# Patient Record
Sex: Male | Born: 1960
Health system: Southern US, Community
[De-identification: ages and names within clinical notes are randomized; demographics above are authoritative.]

## PROBLEM LIST (undated history)

## (undated) DIAGNOSIS — E669 Obesity, unspecified: Secondary | ICD-10-CM

## (undated) DIAGNOSIS — Z6830 Body mass index (BMI) 30.0-30.9, adult: Secondary | ICD-10-CM

## (undated) DIAGNOSIS — N529 Male erectile dysfunction, unspecified: Secondary | ICD-10-CM

## (undated) HISTORY — DX: Male erectile dysfunction, unspecified: N52.9

## (undated) HISTORY — DX: Obesity, unspecified: E66.9

## (undated) HISTORY — DX: Body mass index (BMI) 30.0-30.9, adult: Z68.30

---

## 2017-05-05 ENCOUNTER — Encounter: Payer: Self-pay | Admitting: Family Medicine

## 2017-05-05 ENCOUNTER — Ambulatory Visit (INDEPENDENT_AMBULATORY_CARE_PROVIDER_SITE_OTHER): Payer: BLUE CROSS/BLUE SHIELD | Admitting: Family Medicine

## 2017-05-05 VITALS — BP 144/94 | HR 86 | Temp 98.5°F | Resp 18 | Ht 70.98 in | Wt 227.0 lb

## 2017-05-05 DIAGNOSIS — Z13220 Encounter for screening for lipoid disorders: Secondary | ICD-10-CM

## 2017-05-05 DIAGNOSIS — Z125 Encounter for screening for malignant neoplasm of prostate: Secondary | ICD-10-CM | POA: Diagnosis not present

## 2017-05-05 DIAGNOSIS — Z0000 Encounter for general adult medical examination without abnormal findings: Secondary | ICD-10-CM | POA: Diagnosis not present

## 2017-05-05 DIAGNOSIS — Z131 Encounter for screening for diabetes mellitus: Secondary | ICD-10-CM | POA: Diagnosis not present

## 2017-05-05 DIAGNOSIS — Z23 Encounter for immunization: Secondary | ICD-10-CM | POA: Diagnosis not present

## 2017-05-05 DIAGNOSIS — Z1211 Encounter for screening for malignant neoplasm of colon: Secondary | ICD-10-CM | POA: Diagnosis not present

## 2017-05-05 DIAGNOSIS — Z1159 Encounter for screening for other viral diseases: Secondary | ICD-10-CM | POA: Diagnosis not present

## 2017-05-05 DIAGNOSIS — N529 Male erectile dysfunction, unspecified: Secondary | ICD-10-CM | POA: Diagnosis not present

## 2017-05-05 MED ORDER — SILDENAFIL CITRATE 100 MG PO TABS: 100.0000 mg | ORAL_TABLET | Freq: Every day | ORAL | 11 refills | Status: DC | PRN

## 2017-05-05 MED ORDER — SILDENAFIL CITRATE 100 MG PO TABS: 100.0000 mg | ORAL_TABLET | Freq: Every day | ORAL | 0 refills | Status: DC | PRN

## 2017-05-05 NOTE — Progress Notes (Signed)
Chief Complaint  Patient presents with  . Medication Refill    viagra    HPI  Patient is here to establish care and get a physical exam He also needs refills of his viagra.  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 [employer]  He reports that he has been on Viagra for more than 10 years He denies history of hypertension, hyperlipidemia or diabetes He reports that he has never had any surgeries or injuries He does not know why he has ED He reports that his ED is that he has difficulty consistently achieving erections He states that he gets about 5 tabs at a time and that works for him He denies headaches, runny nose, chest pains or sustained erections  His family history of  Negative for dm, cad and hypertension He puffs cigars but "I don't inhale I just puff"   Dental exam: up to date Eye exam: not up to date Exercise: daily walking at work   History reviewed. No pertinent past medical history.  Current Outpatient Prescriptions  Medication Sig Dispense Refill  . sildenafil (VIAGRA) 100 MG tablet Take 1 tablet (100 mg total) by mouth daily as needed for erectile dysfunction. 5 tablet 11   No current facility-administered medications for this visit.     Allergies: No Known Allergies  History reviewed. No pertinent surgical history.  Social History   Social History  . Marital status: Single    Spouse name: N/A  . Number of children: N/A  . Years of education: N/A   Social History Main Topics  . Smoking status: Current Some Day Smoker    Types: Cigars  . Smokeless tobacco: Never Used  . Alcohol use 1.8 oz/week    3 Cans of beer per week  . Drug use: No  . Sexual activity: Yes    Birth control/ protection: Condom   Other Topics Concern  . None   Social History Narrative  . None    Review of Systems  Constitutional: Negative for chills, fever, malaise/fatigue and weight loss.  Eyes: Negative for blurred vision and double vision.  Respiratory: Negative  for cough, shortness of breath and wheezing.   Cardiovascular: Negative for chest pain and palpitations.  Gastrointestinal: Negative for abdominal pain, nausea and vomiting.  Genitourinary: Negative for dysuria, frequency and urgency.  Musculoskeletal: Negative for back pain, myalgias and neck pain.  Neurological: Negative for dizziness, tingling and headaches.  Psychiatric/Behavioral: Negative for depression. The patient is not nervous/anxious.     Objective: Vitals:   05/05/17 0928  BP: (!) 146/82  Pulse: 86  Resp: 18  Temp: 98.5 F (36.9 C)  TempSrc: Oral  SpO2: 97%  Weight: 227 lb (103 kg)  Height: 5' 10.98" (1.803 m)    Physical Exam  Constitutional: He is oriented to person, place, and time. He appears well-developed and well-nourished.  HENT:  Head: Normocephalic and atraumatic.  Right Ear: External ear normal.  Left Ear: External ear normal.  Mouth/Throat: Oropharynx is clear and moist.  Eyes: Conjunctivae and EOM are normal.  Neck: Normal range of motion. No thyromegaly present.  Cardiovascular: Normal rate, regular rhythm and normal heart sounds.   Pulmonary/Chest: Effort normal and breath sounds normal. No respiratory distress. He has no wheezes.  Abdominal: Soft. Bowel sounds are normal. He exhibits no distension. There is no tenderness. There is no guarding.  Musculoskeletal: Normal range of motion. He exhibits no edema.  Neurological: He is alert and oriented to person, place, and time.  Skin: Skin is warm. Capillary refill takes less than 2 seconds.  Psychiatric: He has a normal mood and affect. His behavior is normal. Judgment and thought content normal.    Assessment and Plan Fotios was seen today for medication refill.  Diagnoses and all orders for this visit:  Health maintenance examination- discussed age appropriate screenings and immunizations  Erectile dysfunction, unspecified erectile dysfunction type -     TestT+TestF+SHBG  Screening for  prostate cancer -     PSA  Screening, lipid -     Lipid panel -     Comprehensive metabolic panel  Screening for diabetes mellitus -     Comprehensive metabolic panel -     Hemoglobin A1c  Need for Tdap vaccination -     Tdap vaccine greater than or equal to 7yo IM  Encounter for screening colonoscopy -     HM COLONOSCOPY  Other orders -     Discontinue: sildenafil (VIAGRA) 100 MG tablet; Take 1 tablet (100 mg total) by mouth daily as needed for erectile dysfunction. -     sildenafil (VIAGRA) 100 MG tablet; Take 1 tablet (100 mg total) by mouth daily as needed for erectile dysfunction.     Zooey Schreurs A Glenn Christo

## 2017-05-05 NOTE — Patient Instructions (Addendum)
     IF you received an x-ray today, you will receive an invoice from [facility]. Please contact [facility] at [phone] with questions or concerns regarding your invoice.   IF you received labwork today, you will receive an invoice from [facility]. Please contact [facility] at [phone] with questions or concerns regarding your invoice.   Our billing staff will not be able to assist you with questions regarding bills from these companies.  You will be contacted with the lab results as soon as they are available. The fastest way to get your results is to activate your My Chart account. Instructions are located on the last page of this paperwork. If you have not heard from us regarding the results in 2 weeks, please contact this office.     Colorectal Cancer Screening Colorectal cancer screening is a group of tests used to check for colorectal cancer. Colorectal refers to your colon and rectum. Your colon and rectum are located at the end of your large intestine and carry your bowel movements out of your body. Why is colorectal cancer screening done? It is common for abnormal growths (polyps) to form in the lining of your colon, especially as you get older. These polyps can be cancerous or become cancerous. If colorectal cancer is found at an early stage, it is treatable. Who should be screened for colorectal cancer? Screening is recommended for all adults at average risk starting at age 50. Tests may be recommended every 1 to 10 years. Your health care provider may recommend earlier or more frequent screening if you have:  A history of colorectal cancer or polyps.  A family member with a history of colorectal cancer or polyps.  Inflammatory bowel disease, such as ulcerative colitis or Crohn disease.  A type of hereditary colon cancer syndrome.  Colorectal cancer symptoms.  Types of screening tests There are several types of colorectal screening tests. They  include:  Guaiac-based fecal occult blood testing.  Fecal immunochemical test (FIT).  Stool DNA test.  Barium enema.  Virtual colonoscopy.  Sigmoidoscopy. During this test, a sigmoidoscope is used to examine your rectum and lower colon. A sigmoidoscope is a flexible tube with a camera that is inserted through your anus into your rectum and lower colon.  Colonoscopy. During this test, a colonoscope is used to examine your entire colon. A colonoscope is a long, thin, flexible tube with a camera. This test examines your entire colon and rectum.  This information is not intended to replace advice given to you by your health care provider. Make sure you discuss any questions you have with your health care provider. Document Released: 04/16/2010 Document Revised: 06/05/2016 Document Reviewed: 02/02/2014 Elsevier Interactive Patient Education  2017 Elsevier Inc.  

## 2017-05-06 LAB — HCV INTERPRETATION

## 2017-05-06 LAB — HCV AB W/RFLX TO VERIFICATION: HCV Ab: <0.1 s/co ratio (ref 0.0–0.9)

## 2017-05-07 LAB — TESTT+TESTF+SHBG
Sex Hormone Binding: 45.6 nmol/L (ref 19.3–76.4)
Testosterone, Free: 13.6 pg/mL (ref 7.2–24.0)
Testosterone, total: 519.2 ng/dL (ref 264.0–916.0)

## 2017-05-07 LAB — COMPREHENSIVE METABOLIC PANEL
ALT: 29 IU/L (ref 0–44)
AST: 21 IU/L (ref 0–40)
Albumin/Globulin Ratio: 1.8 (ref 1.2–2.2)
Albumin: 4.3 g/dL (ref 3.5–5.5)
Alkaline Phosphatase: 65 IU/L (ref 39–117)
BUN/Creatinine Ratio: 12 (ref 9–20)
BUN: 12 mg/dL (ref 6–24)
Bilirubin Total: 0.5 mg/dL (ref 0.0–1.2)
CO2: 22 mmol/L (ref 20–29)
Calcium: 9 mg/dL (ref 8.7–10.2)
Chloride: 103 mmol/L (ref 96–106)
Creatinine, Ser: 0.97 mg/dL (ref 0.76–1.27)
GFR calc Af Amer: 101 mL/min/{1.73_m2} (ref >59)
GFR calc non Af Amer: 88 mL/min/{1.73_m2} (ref >59)
Globulin, Total: 2.4 g/dL (ref 1.5–4.5)
Glucose: 107 mg/dL — ABNORMAL HIGH (ref 65–99)
Potassium: 4.3 mmol/L (ref 3.5–5.2)
Sodium: 138 mmol/L (ref 134–144)
Total Protein: 6.7 g/dL (ref 6.0–8.5)

## 2017-05-07 LAB — LIPID PANEL
Chol/HDL Ratio: 4.4 ratio (ref 0.0–5.0)
Cholesterol, Total: 168 mg/dL (ref 100–199)
HDL: 38 mg/dL — ABNORMAL LOW (ref >39)
LDL Calculated: 114 mg/dL — ABNORMAL HIGH (ref 0–99)
Triglycerides: 80 mg/dL (ref 0–149)
VLDL Cholesterol Cal: 16 mg/dL (ref 5–40)

## 2017-05-07 LAB — HEMOGLOBIN A1C
Est. average glucose Bld gHb Est-mCnc: 100 mg/dL
Hgb A1c MFr Bld: 5.1 % (ref 4.8–5.6)

## 2017-05-07 LAB — PSA: Prostate Specific Ag, Serum: 1.5 ng/mL (ref 0.0–4.0)

## 2017-05-28 ENCOUNTER — Other Ambulatory Visit: Payer: Self-pay | Admitting: Physician Assistant

## 2017-05-28 ENCOUNTER — Encounter: Payer: Self-pay | Admitting: Physician Assistant

## 2017-05-28 ENCOUNTER — Ambulatory Visit (INDEPENDENT_AMBULATORY_CARE_PROVIDER_SITE_OTHER): Payer: BLUE CROSS/BLUE SHIELD | Admitting: Physician Assistant

## 2017-05-28 ENCOUNTER — Telehealth: Payer: Self-pay | Admitting: Physician Assistant

## 2017-05-28 ENCOUNTER — Ambulatory Visit (HOSPITAL_COMMUNITY)
Admission: RE | Admit: 2017-05-28 | Discharge: 2017-05-28 | Disposition: A | Discharge status: Home / Self Care | Payer: BLUE CROSS/BLUE SHIELD | Source: Ambulatory Visit | Attending: Physician Assistant | Admitting: Physician Assistant

## 2017-05-28 VITALS — BP 148/93 | HR 86 | Temp 98.7°F | Resp 18 | Ht 70.98 in | Wt 225.2 lb

## 2017-05-28 DIAGNOSIS — N5089 Other specified disorders of the male genital organs: Secondary | ICD-10-CM

## 2017-05-28 DIAGNOSIS — N50811 Right testicular pain: Secondary | ICD-10-CM

## 2017-05-28 DIAGNOSIS — N433 Hydrocele, unspecified: Secondary | ICD-10-CM | POA: Diagnosis not present

## 2017-05-28 DIAGNOSIS — N451 Epididymitis: Secondary | ICD-10-CM

## 2017-05-28 DIAGNOSIS — R030 Elevated blood-pressure reading, without diagnosis of hypertension: Secondary | ICD-10-CM | POA: Diagnosis not present

## 2017-05-28 LAB — POCT URINALYSIS DIP (MANUAL ENTRY)
Bilirubin, UA: NEGATIVE
Blood, UA: NEGATIVE
Glucose, UA: NEGATIVE mg/dL
Ketones, POC UA: NEGATIVE mg/dL
Nitrite, UA: NEGATIVE
Protein Ur, POC: NEGATIVE mg/dL
Spec Grav, UA: 1.015 (ref 1.010–1.025)
Urobilinogen, UA: 1 E.U./dL
pH, UA: 7 (ref 5.0–8.0)

## 2017-05-28 MED ORDER — CIPROFLOXACIN HCL 500 MG PO TABS: 500.0000 mg | ORAL_TABLET | Freq: Two times a day (BID) | ORAL | 0 refills | Status: DC

## 2017-05-28 NOTE — Progress Notes (Signed)
Brad Simmons  MRN: [id number] DOB: 27-May-1961  PCP: Doristine Bosworth, MD  Chief Complaint  Patient presents with  . pulled muscle    right side of groin area x1week   . Groin Swelling    Subjective:  Pt presents to clinic for right side groin pain that started about 5 days ago without known injury.  Sore in right lower abdomen and radiates right side scrotum that is also swollen.  He has used advil and that helped some with the pain but then it started getting worse.  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  He has never had a hernia in the past.  Lifting does not worsen the pain.  No trauma to the scrotum.  Sexually active - no change in partners - wears condoms everytime  History is obtained by patient.  Review of Systems  Constitutional: Negative for chills and fever.  Gastrointestinal: Positive for abdominal pain (soreness). Negative for nausea and vomiting.  Genitourinary: Positive for testicular pain. Negative for discharge, dysuria and hematuria.    Patient Active Problem List   Diagnosis Date Noted  . Erectile dysfunction 05/05/2017    Current Outpatient Prescriptions on File Prior to Visit  Medication Sig Dispense Refill  . sildenafil (VIAGRA) 100 MG tablet Take 1 tablet (100 mg total) by mouth daily as needed for erectile dysfunction. 5 tablet 11   No current facility-administered medications on file prior to visit.     No Known Allergies  No past medical history on file. Social History   Social History Narrative  . No narrative on file   Social History  Substance Use Topics  . Smoking status: Current Some Day Smoker    Types: Cigars  . Smokeless tobacco: Never Used  . Alcohol use 1.8 oz/week    3 Cans of beer per week   family history is not on file.     Objective:  BP (!) 148/93   Pulse 86   Temp 98.7 F (37.1 C) (Oral)   Resp 18   Ht 5' 10.98" (1.803 m)   Wt 225 lb 3.2 oz (102.2 kg)   SpO2 98%   BMI 31.43  kg/m  Body mass index is 31.43 kg/m.  Physical Exam  Constitutional: He is oriented to person, place, and time and well-developed, well-nourished, and in no distress.  HENT:  Head: Normocephalic and atraumatic.  Right Ear: External ear normal.  Left Ear: External ear normal.  Eyes: Conjunctivae are normal.  Neck: Normal range of motion.  Cardiovascular: Normal rate, regular rhythm and normal heart sounds.   No murmur heard. Pulmonary/Chest: Effort normal and breath sounds normal. He has no wheezes.  Abdominal: No hernia. Hernia confirmed negative in the right inguinal area and confirmed negative in the left inguinal area.  Genitourinary: He exhibits testicular tenderness and epididymal tenderness.  Genitourinary Comments: Right side testicle hard and TTP  Neurological: He is alert and oriented to person, place, and time. Gait normal.  Skin: Skin is warm and dry.  Psychiatric: Mood, memory, affect and judgment normal.   Results for orders placed or performed in visit on 05/28/17  POCT urinalysis dipstick  Result Value Ref Range   Color, UA yellow yellow   Clarity, UA clear clear   Glucose, UA negative negative mg/dL   Bilirubin, UA negative negative   Ketones, POC UA negative negative mg/dL   Spec Grav, UA 7.829 5.621 - 1.025   Blood, UA negative  negative   pH, UA 7.0 5.0 - 8.0   Protein Ur, POC negative negative mg/dL   Urobilinogen, UA 1.0 0.2 or 1.0 E.U./dL   Nitrite, UA Negative Negative   Leukocytes, UA Trace (A) Negative   [place] Scrotum  Result Date: 05/28/2017 CLINICAL DATA:  Right testicle pain and swelling EXAM: SCROTAL ULTRASOUND DOPPLER ULTRASOUND OF THE TESTICLES TECHNIQUE: Complete ultrasound examination of the testicles, epididymis, and other scrotal structures was performed. Color and spectral Doppler ultrasound were also utilized to evaluate blood flow to the testicles. COMPARISON:  None. FINDINGS: Right testicle Measurements: 4.0 x 2.8 x 3.3 cm. No mass or  microlithiasis visualized. Left testicle Measurements: 4.5 x 2.3 x 2.8 cm. No mass or microlithiasis visualized. Right epididymis: Enlarged epididymis measuring 2.6 x 2.1 x 1.6 cm. Associated 9 mm epididymal cyst on the right. Increased blood flow to the epididymis on Doppler. Left epididymis:  Normal in size and appearance. Hydrocele:  Moderate right hydrocele.  Small left hydrocele Varicocele:  None visualized. Pulsed Doppler interrogation of both testes demonstrates normal low resistance arterial and venous waveforms bilaterally. IMPRESSION: Enlarged and hyperemic right epididymis suggesting epididymitis. Moderate right hydrocele Normal testes. These results will be called to the ordering clinician or representative by the Radiologist Assistant, and communication documented in the PACS or zVision Dashboard. Electronically Signed   By: Marlan Palauharles  Clark M.D.   On: 05/28/2017 13:47   [place] Art/ven Flow Abd Pelv Doppler  Result Date: 05/28/2017 CLINICAL DATA:  Right testicle pain and swelling EXAM: SCROTAL ULTRASOUND DOPPLER ULTRASOUND OF THE TESTICLES TECHNIQUE: Complete ultrasound examination of the testicles, epididymis, and other scrotal structures was performed. Color and spectral Doppler ultrasound were also utilized to evaluate blood flow to the testicles. COMPARISON:  None. FINDINGS: Right testicle Measurements: 4.0 x 2.8 x 3.3 cm. No mass or microlithiasis visualized. Left testicle Measurements: 4.5 x 2.3 x 2.8 cm. No mass or microlithiasis visualized. Right epididymis: Enlarged epididymis measuring 2.6 x 2.1 x 1.6 cm. Associated 9 mm epididymal cyst on the right. Increased blood flow to the epididymis on Doppler. Left epididymis:  Normal in size and appearance. Hydrocele:  Moderate right hydrocele.  Small left hydrocele Varicocele:  None visualized. Pulsed Doppler interrogation of both testes demonstrates normal low resistance arterial and venous waveforms bilaterally. IMPRESSION: Enlarged and hyperemic  right epididymis suggesting epididymitis. Moderate right hydrocele Normal testes. These results will be called to the ordering clinician or representative by the Radiologist Assistant, and communication documented in the PACS or zVision Dashboard. Electronically Signed   By: Marlan Palauharles  Clark M.D.   On: 05/28/2017 13:47    Assessment and Plan :  Testicular pain, right - Plan: POCT urinalysis dipstick, US Scrotum, US Art/Ven Flow Abd Pelv Doppler, GC/Chlamydia Probe Amp  Swelling of right testicle - Plan: US Scrotum, US Art/Ven Flow Abd Pelv Doppler, GC/Chlamydia Probe Amp  Epididymitis - Plan: GC/Chlamydia Probe Amp, ciprofloxacin (CIPRO) 500 MG tablet  Elevated BP without diagnosis of hypertension - pt will need to recheck with me in 2 weeks regarding resolution of symptoms and recheck BP when feeling well.  Benny LennertSarah Dalasia Predmore PA-C  [facility] at [facility] 05/29/2017 7:10 AM

## 2017-05-28 NOTE — Telephone Encounter (Addendum)
Waiting on return call - we can not call patient as we do not have a correct phone number  Tried to call patient and his number was not in service.  Called emergency contact and spoke with father and mother who did not have his phone number but stated that they would see him later today and let him know to call our office.  Please get the correct number for the patient and update in his chart.  His US showed epididymitis - I have sent an antibiotic called Cipro to the pharmacy for him to take 1 pill bid.  He also has a collection of fluid (a cyst) on that testicle - the swelling should improve with the abx.  Please make the patient an appt to see me in 10-14d to recheck this and his BP.

## 2017-05-28 NOTE — Patient Instructions (Addendum)
For your ultrasound -  [facility]  [address], [place], [place] [id number] APPOINTMENT TIME IS 1:00PM   Once I get the results I will speak with you about treatment.  IF you received an x-ray today, you will receive an invoice from [facility]. Please contact [facility] at [phone] with questions or concerns regarding your invoice.   IF you received labwork today, you will receive an invoice from [facility]. Please contact [facility] at [phone] with questions or concerns regarding your invoice.   Our billing staff will not be able to assist you with questions regarding bills from these companies.  You will be contacted with the lab results as soon as they are available. The fastest way to get your results is to activate your My Chart account. Instructions are located on the last page of this paperwork. If you have not heard from us regarding the results in 2 weeks, please contact this office.

## 2017-05-29 ENCOUNTER — Encounter: Payer: Self-pay | Admitting: Physician Assistant

## 2017-05-29 NOTE — Telephone Encounter (Addendum)
PATIENT IS RETURNING SARAH'S CALL FROM Thursday (05/28/17). I CONFIRMED WITH PATIENT THAT WE DO HAVE THE CORRECT CELL PHONE NUMBER. ALSO, [facility] AT 9:20am FOR A FOLLOW-UP AND BLOOD PRESSURE CHECK. BEST PHONE [phone] (CELL) PHARMACY CHOICE IS [facility] [address]. MBC

## 2017-06-01 ENCOUNTER — Telehealth: Payer: Self-pay | Admitting: Physician Assistant

## 2017-06-01 NOTE — Telephone Encounter (Signed)
Pt is needing to get a work note for Thursday 05/28/17 Friday 05/29/17 and Monday 06/01/17 and returning back to work on 06/02/17 this is not listed as [employer]   Best number [phone]

## 2017-06-01 NOTE — Telephone Encounter (Signed)
See note below. Please advise.  

## 2017-06-02 ENCOUNTER — Encounter: Payer: Self-pay | Admitting: Emergency Medicine

## 2017-06-02 LAB — GC/CHLAMYDIA PROBE AMP
Chlamydia trachomatis, NAA: NEGATIVE
Neisseria gonorrhoeae by PCR: NEGATIVE

## 2017-06-02 NOTE — Telephone Encounter (Signed)
Addressed in a different phone note.  A work note is fine considering we could not get in touch with the patient with his phone number.

## 2017-06-03 NOTE — Telephone Encounter (Signed)
Phone call attempted. VM box not set up.

## 2017-06-12 ENCOUNTER — Ambulatory Visit: Payer: BLUE CROSS/BLUE SHIELD | Admitting: Physician Assistant

## 2017-06-12 NOTE — Telephone Encounter (Signed)
Patient did not show up for his appt.

## 2017-06-16 ENCOUNTER — Encounter: Payer: Self-pay | Admitting: *Deleted

## 2017-06-16 NOTE — Progress Notes (Signed)
Unable to leave message voice mail not set up.   Letter sent

## 2017-11-04 ENCOUNTER — Other Ambulatory Visit: Payer: Self-pay

## 2017-11-04 ENCOUNTER — Ambulatory Visit: Payer: BLUE CROSS/BLUE SHIELD | Admitting: Physician Assistant

## 2017-11-04 ENCOUNTER — Encounter: Payer: Self-pay | Admitting: Physician Assistant

## 2017-11-04 VITALS — BP 150/92 | HR 94 | Temp 98.6°F | Resp 18 | Ht 70.9 in | Wt 226.4 lb

## 2017-11-04 DIAGNOSIS — R030 Elevated blood-pressure reading, without diagnosis of hypertension: Secondary | ICD-10-CM

## 2017-11-04 DIAGNOSIS — M62838 Other muscle spasm: Secondary | ICD-10-CM | POA: Diagnosis not present

## 2017-11-04 MED ORDER — CYCLOBENZAPRINE HCL 10 MG PO TABS: 5.0000 mg | ORAL_TABLET | Freq: Three times a day (TID) | ORAL | 0 refills | Status: DC | PRN

## 2017-11-04 MED ORDER — MELOXICAM 15 MG PO TABS: 7.5000 mg | ORAL_TABLET | Freq: Every day | ORAL | 0 refills | Status: DC

## 2017-11-04 NOTE — Progress Notes (Signed)
11/04/2017 4:46 PM   DOB: [birth year] / MRN: [id number]  SUBJECTIVE:  Brad Simmons is a 56 y.o. male presenting for shoulder pain (as he points to his right trapezius).  He did have some numbness in the right hand near the onset of this problem but states this has resolved.  He denies weakness.  He has had this problem before and flexeril solved his problem. He feels that he is getting worse.  Denies a history of gastritis and kidney disease.   He has No Known Allergies.   He  has no past medical history on file.    He  reports that he has been smoking cigars.  he has never used smokeless tobacco. He reports that he drinks about 1.8 oz of alcohol per week. He reports that he does not use drugs. He  reports that he currently engages in sexual activity. He reports using the following method of birth control/protection: Condom. The patient  has no past surgical history on file.  His family history is not on file.  Review of Systems  Constitutional: Negative for chills, diaphoresis and fever.  Eyes: Negative.   Respiratory: Negative for cough, hemoptysis, sputum production, shortness of breath and wheezing.   Cardiovascular: Negative for chest pain, orthopnea and leg swelling.  Gastrointestinal: Negative for nausea.  Skin: Negative for rash.  Neurological: Negative for dizziness, sensory change, speech change, focal weakness and headaches.    The problem list and medications were reviewed and updated by myself where necessary and exist elsewhere in the encounter.   OBJECTIVE:  BP (!) 150/92 (BP Location: Left Arm, Patient Position: Sitting, Cuff Size: Large)   Pulse 94   Temp 98.6 F (37 C) (Oral)   Resp 18   Ht 5' 10.9" (1.801 m)   Wt 226 lb 6.4 oz (102.7 kg)   SpO2 99%   BMI 31.67 kg/m     Physical Exam  Constitutional: He is oriented to person, place, and time. He appears well-developed. He is active and cooperative.  Non-toxic appearance.  Cardiovascular: Normal rate,  regular rhythm, S1 normal, S2 normal, normal heart sounds, intact distal pulses and normal pulses. Exam reveals no gallop and no friction rub.  No murmur heard. Pulmonary/Chest: Effort normal. No stridor. No tachypnea. No respiratory distress. He has no wheezes. He has no rales.  Abdominal: He exhibits no distension.  Musculoskeletal: He exhibits tenderness (right upper trapezius). He exhibits no edema or deformity.       Cervical back: He exhibits pain and spasm. He exhibits normal range of motion, no bony tenderness, no swelling, no edema and no deformity.  Neurological: He is alert and oriented to person, place, and time. He has normal reflexes. He displays no atrophy, no tremor and normal reflexes. No cranial nerve deficit or sensory deficit. He exhibits normal muscle tone. He displays no seizure activity. Coordination normal.  Skin: Skin is warm and dry. He is not diaphoretic. No pallor.  Vitals reviewed.   No results found for this or any previous visit (from the past 72 hour(s)).  No results found.  ASSESSMENT AND PLAN:  Brad Simmons was seen today for shoulder pain.  Diagnoses and all orders for this visit:  Trapezius muscle spasm -     meloxicam (MOBIC) 15 MG tablet; Take 0.5-1 tablets (7.5-15 mg total) by mouth daily. Take with food. Do not take Ibuprofen, Goody's, or Aleve while taking this medication. -     cyclobenzaprine (FLEXERIL) 10 MG tablet; Take 0.5-1 tablets (  5-10 mg total) by mouth 3 (three) times daily as needed.  Elevated blood pressure reading: Advised that he purchase a cuff and come back if his pressures are consistently measuring greater than 140/90.    The patient is advised to call or return to clinic if he does not see an improvement in symptoms, or to seek the care of the closest emergency department if he worsens with the above plan.   Deliah BostonMichael Camesha Farooq, MHS, PA-C Primary Care at [facility] 11/04/2017 4:46 PM

## 2017-11-04 NOTE — Patient Instructions (Signed)
Please monitor you blood pressure at home and keep a written diary.  If your blood pressure is greater than 140/90 consistently then I would like to see you back in clinic for discussion of the numbers and possibly starting a blood pressure medication. The best way to manage BP without a medication is 150 minutes of walking weekly and eating a diet low in salt and high in whole fruits and vegetables. Pursuing this healthy lifestyle will also decrease the risk of other chronic conditions, such as diabetes, obesity, stroke and heart attack.

## 2017-11-30 ENCOUNTER — Encounter: Payer: Self-pay | Admitting: Family Medicine

## 2017-11-30 ENCOUNTER — Other Ambulatory Visit: Payer: Self-pay

## 2017-11-30 ENCOUNTER — Ambulatory Visit: Payer: BLUE CROSS/BLUE SHIELD | Admitting: Family Medicine

## 2017-11-30 VITALS — BP 154/82 | HR 87 | Temp 98.4°F | Resp 17 | Ht 70.9 in | Wt 226.0 lb

## 2017-11-30 DIAGNOSIS — M79601 Pain in right arm: Secondary | ICD-10-CM | POA: Diagnosis not present

## 2017-11-30 DIAGNOSIS — R030 Elevated blood-pressure reading, without diagnosis of hypertension: Secondary | ICD-10-CM | POA: Diagnosis not present

## 2017-11-30 DIAGNOSIS — M62838 Other muscle spasm: Secondary | ICD-10-CM

## 2017-11-30 DIAGNOSIS — M5412 Radiculopathy, cervical region: Secondary | ICD-10-CM | POA: Diagnosis not present

## 2017-11-30 MED ORDER — CYCLOBENZAPRINE HCL 5 MG PO TABS: ORAL_TABLET | ORAL | 0 refills | Status: DC

## 2017-11-30 NOTE — Patient Instructions (Addendum)
Your shoulder and arm pain may be related to a pinched nerve from the neck. Try the muscle relaxant cyclobenzaprine every 8 hours as needed. Do not drive or operate machinery with that medicine and be careful as it can cause dizziness or unsteadiness. Occasional ibuprofen is okay if your blood pressure is below 140/90. If that blood pressure remains elevated, we may need to consider a medication. See information below on cervical radiculopathy, and follow-up in one week to decide if other meds needed.  Sooner if worse.   Return to the clinic or go to the nearest emergency room if any of your symptoms worsen or new symptoms occur.   Cervical Radiculopathy Cervical radiculopathy means that a nerve in the neck is pinched or bruised. This can cause pain or loss of feeling (numbness) that runs from your neck to your arm and fingers. Follow these instructions at home: Managing pain  Take over-the-counter and prescription medicines only as told by your doctor.  If directed, put ice on the injured or painful area. ? Put ice in a plastic bag. ? Place a towel between your skin and the bag. ? Leave the ice on for 20 minutes, 2-3 times per day.  If ice does not help, you can try using heat. Take a warm shower or warm bath, or use a heat pack as told by your doctor.  You may try a gentle neck and shoulder massage. Activity  Rest as needed. Follow instructions from your doctor about any activities to avoid.  Do exercises as told by your doctor or physical therapist. General instructions  If you were given a soft collar, wear it as told by your doctor.  Use a flat pillow when you sleep.  Keep all follow-up visits as told by your doctor. This is important. Contact a doctor if:  Your condition does not improve with treatment. Get help right away if:  Your pain gets worse and is not controlled with medicine.  You lose feeling or feel weak in your hand, arm, face, or leg.  You have a  fever.  You have a stiff neck.  You cannot control when you poop or pee (have incontinence).  You have trouble with walking, balance, or talking. This information is not intended to replace advice given to you by your health care provider. Make sure you discuss any questions you have with your health care provider. Document Released: 10/16/2011 Document Revised: 04/03/2016 Document Reviewed: 12/21/2014 Elsevier Interactive Patient Education  2018 [employer]     IF you received an x-ray today, you will receive an invoice from [facility]. Please contact [facility] at [phone] with questions or concerns regarding your invoice.   IF you received labwork today, you will receive an invoice from [facility]. Please contact [facility] at [phone] with questions or concerns regarding your invoice.   Our billing staff will not be able to assist you with questions regarding bills from these companies.  You will be contacted with the lab results as soon as they are available. The fastest way to get your results is to activate your My Chart account. Instructions are located on the last page of this paperwork. If you have not heard from us regarding the results in 2 weeks, please contact this office.

## 2017-11-30 NOTE — Progress Notes (Signed)
Subjective:  By signing my name below, I, Stann Ore, attest that this documentation has been prepared under the direction and in the presence of Meredith Staggers, MD. Electronically Signed: Stann Ore, Scribe. 11/30/2017 , 11:54 AM .  Patient was seen in Room 12 .   Patient ID: Brad Simmons, male    DOB: 1961/09/09, 57 y.o.   MRN: [id number] Chief Complaint  Patient presents with  . right shoulder pain    onset: 11/28/17, tried otc aleve for pain last night at 10 pm an advises it helped some but no pain med today.     HPI Brad Simmons is a 57 y.o. male  Here for right shoulder pain. Patient was seen on Dec 26th, 2018 with right trapezius pain as well as numbness in right hand initially, evaluated by Deliah Boston, PA-C, suspected trapezius spasm. He was treated with meloxicam and flexeril. BP was borderline elevated at that visit as well.   Patient states his current pain is different from Dec visit trapezius pain. He reports lifting buckets on Friday (3 days ago) without any pain. But, he noticed pain starting up the next day (2 days ago), initially feeling it in his right forearm, and massaged the area. He informs the pain radiated up into his right upper arm and right shoulder. He denies any neck pain. He's taken Advil yesterday with some improvement, 2 tablets at 2:00PM, 2 tablets at 6:00PM, and then 2 more tablets at 10:00PM. He didn't take any flexeril from last visit. He mentions occasional tingling in his right thumb. He denies any chest pain or shortness of breath.   He does different maintenance work, and would take OTC pain medication for relief. He denies any past surgical history in his right shoulder. He's had similar issues in the past.   Elevated BP He denies history of HTN. He denies history of taking BP medication. He sometimes check his BP outside of office, and machine notes being normal.   Patient Active Problem List   Diagnosis Date Noted  . Erectile dysfunction  05/05/2017   No past medical history on file. No past surgical history on file. No Known Allergies Prior to Admission medications   Medication Sig Start Date End Date Taking? Authorizing Provider  cyclobenzaprine (FLEXERIL) 10 MG tablet Take 0.5-1 tablets (5-10 mg total) by mouth 3 (three) times daily as needed. 11/04/17   Ofilia Neas, PA-C  meloxicam (MOBIC) 15 MG tablet Take 0.5-1 tablets (7.5-15 mg total) by mouth daily. Take with food. Do not take Ibuprofen, Goody's, or Aleve while taking this medication. 11/04/17 12/04/17  Ofilia Neas, PA-C  sildenafil (VIAGRA) 100 MG tablet Take 1 tablet (100 mg total) by mouth daily as needed for erectile dysfunction. 05/05/17   Doristine Bosworth, MD   Social History   Socioeconomic History  . Marital status: Single    Spouse name: Not on file  . Number of children: Not on file  . Years of education: Not on file  . Highest education level: Not on file  Social Needs  . Financial resource strain: Not on file  . Food insecurity - worry: Not on file  . Food insecurity - inability: Not on file  . Transportation needs - medical: Not on file  . Transportation needs - non-medical: Not on file  Occupational History  . Not on file  Tobacco Use  . Smoking status: Current Some Day Smoker    Types: Cigars  . Smokeless tobacco: Never Used  Substance and Sexual Activity  . Alcohol use: Yes    Alcohol/week: 1.8 oz    Types: 3 Cans of beer per week  . Drug use: No  . Sexual activity: Yes    Birth control/protection: Condom  Other Topics Concern  . Not on file  Social History Narrative  . Not on file   Review of Systems  Constitutional: Negative for fatigue and unexpected weight change.  Eyes: Negative for visual disturbance.  Respiratory: Negative for cough, chest tightness and shortness of breath.   Cardiovascular: Negative for chest pain, palpitations and leg swelling.  Gastrointestinal: Negative for abdominal pain and blood in stool.    Musculoskeletal: Positive for arthralgias and myalgias.  Neurological: Negative for dizziness, light-headedness and headaches.       Objective:   Physical Exam  Constitutional: He is oriented to person, place, and time. He appears well-developed and well-nourished.  HENT:  Head: Normocephalic and atraumatic.  Eyes: EOM are normal. Pupils are equal, round, and reactive to light.  Neck: No JVD present. Carotid bruit is not present.  Cardiovascular: Normal rate, regular rhythm and normal heart sounds.  No murmur heard. Pulmonary/Chest: Effort normal and breath sounds normal. He has no rales.  Musculoskeletal: He exhibits no edema.  C-spine: no midline bony tenderness, decreased extension lacking approximately 50-60 degrees, does have some reproducible pain of paraspinals in right; limitation in right rotation 45 degrees, versus left rotation 80 degrees, equal lateral flexion, slight spasms of trapezius, minimum spasms of paraspinals Right shoulder: minimal tenderness over AC joint, minimal tenderness across the deltoid, minimal discomfort across the right tricep; intact biceps and triceps strength; intact grip strength; full RTC strength; NVI distally, cap refill <1 second  Neurological: He is alert and oriented to person, place, and time.  Reflex Scores:      Tricep reflexes are 2+ on the right side.      Bicep reflexes are 1+ on the right side and 2+ on the left side.      Brachioradialis reflexes are 2+ on the right side. Skin: Skin is warm and dry.  Psychiatric: He has a normal mood and affect.  Vitals reviewed.   Vitals:   11/30/17 1128  BP: (!) 154/82  Pulse: 87  Resp: 17  Temp: 98.4 F (36.9 C)  TempSrc: Oral  SpO2: 98%  Weight: 226 lb (102.5 kg)  Height: 5' 10.9" (1.801 m)       Assessment & Plan:    Brad Simmons is a 57 y.o. male Right arm pain - Plan: cyclobenzaprine (FLEXERIL) 5 MG tablet Right cervical radiculopathy - Plan: cyclobenzaprine (FLEXERIL) 5 MG  tablet Trapezius muscle spasm - Plan: cyclobenzaprine (FLEXERIL) 5 MG tablet  - Likely overuse last week with secondary spasm of paraspinal musculature/trapezius. Based on radicular symptoms into thumb, possible cervical radiculopathy. No weakness appreciated, reflexes overall reassuring, some difficulty with biceps reflex. Shoulder exam reassuring.  -Trial of heat or ice, gentle range of motion stretching, Flexeril 5 mg 3 times a day when necessary, side effects discussed. If blood pressure remains under 140/90, can try NSAID temporarily. Recheck 1 week to decide on prednisone versus imaging. Sooner if worse  Elevated blood pressure reading  -Monitor home readings, recheck in 1 week to decide if medication needed. Other cardiac risk factors including smoking and age discussed.  Meds ordered this encounter  Medications  . cyclobenzaprine (FLEXERIL) 5 MG tablet    Sig: 1 pill by mouth up to every 8 hours as needed. Start with  one pill by mouth each bedtime as needed due to sedation    Dispense:  15 tablet    Refill:  0   Patient Instructions   Your shoulder and arm pain may be related to a pinched nerve from the neck. Try the muscle relaxant cyclobenzaprine every 8 hours as needed. Do not drive or operate machinery with that medicine and be careful as it can cause dizziness or unsteadiness. Occasional ibuprofen is okay if your blood pressure is below 140/90. If that blood pressure remains elevated, we may need to consider a medication. See information below on cervical radiculopathy, and follow-up in one week to decide if other meds needed.  Sooner if worse.   Return to the clinic or go to the nearest emergency room if any of your symptoms worsen or new symptoms occur.   Cervical Radiculopathy Cervical radiculopathy means that a nerve in the neck is pinched or bruised. This can cause pain or loss of feeling (numbness) that runs from your neck to your arm and fingers. Follow these instructions  at home: Managing pain  Take over-the-counter and prescription medicines only as told by your doctor.  If directed, put ice on the injured or painful area. ? Put ice in a plastic bag. ? Place a towel between your skin and the bag. ? Leave the ice on for 20 minutes, 2-3 times per day.  If ice does not help, you can try using heat. Take a warm shower or warm bath, or use a heat pack as told by your doctor.  You may try a gentle neck and shoulder massage. Activity  Rest as needed. Follow instructions from your doctor about any activities to avoid.  Do exercises as told by your doctor or physical therapist. General instructions  If you were given a soft collar, wear it as told by your doctor.  Use a flat pillow when you sleep.  Keep all follow-up visits as told by your doctor. This is important. Contact a doctor if:  Your condition does not improve with treatment. Get help right away if:  Your pain gets worse and is not controlled with medicine.  You lose feeling or feel weak in your hand, arm, face, or leg.  You have a fever.  You have a stiff neck.  You cannot control when you poop or pee (have incontinence).  You have trouble with walking, balance, or talking. This information is not intended to replace advice given to you by your health care provider. Make sure you discuss any questions you have with your health care provider. Document Released: 10/16/2011 Document Revised: 04/03/2016 Document Reviewed: 12/21/2014 Elsevier Interactive Patient Education  2018 [employer].     IF you received an x-ray today, you will receive an invoice from [facility]. Please contact [facility] at [phone] with questions or concerns regarding your invoice.   IF you received labwork today, you will receive an invoice from [place]. Please contact [facility] at [phone] with questions or concerns regarding your invoice.   Our billing staff will not be able  to assist you with questions regarding bills from these companies.  You will be contacted with the lab results as soon as they are available. The fastest way to get your results is to activate your My Chart account. Instructions are located on the last page of this paperwork. If you have not heard from [place] regarding the results in 2 weeks, please contact this office.      I personally performed  the services described in this documentation, which was scribed in my presence. The recorded information has been reviewed and considered for accuracy and completeness, addended by me as needed, and agree with information above.  Signed,   Meredith StaggersJeffrey Destynie Toomey, MD Primary Care at [facility].  11/30/17 12:02 PM

## 2017-12-04 ENCOUNTER — Ambulatory Visit (INDEPENDENT_AMBULATORY_CARE_PROVIDER_SITE_OTHER): Payer: BLUE CROSS/BLUE SHIELD

## 2017-12-04 ENCOUNTER — Ambulatory Visit: Payer: BLUE CROSS/BLUE SHIELD | Admitting: Family Medicine

## 2017-12-04 ENCOUNTER — Encounter: Payer: Self-pay | Admitting: Family Medicine

## 2017-12-04 VITALS — BP 138/94 | HR 97 | Temp 98.4°F | Resp 18 | Ht 70.9 in | Wt 225.0 lb

## 2017-12-04 DIAGNOSIS — M79601 Pain in right arm: Secondary | ICD-10-CM | POA: Diagnosis not present

## 2017-12-04 DIAGNOSIS — M542 Cervicalgia: Secondary | ICD-10-CM

## 2017-12-04 DIAGNOSIS — R030 Elevated blood-pressure reading, without diagnosis of hypertension: Secondary | ICD-10-CM | POA: Diagnosis not present

## 2017-12-04 DIAGNOSIS — Z131 Encounter for screening for diabetes mellitus: Secondary | ICD-10-CM | POA: Diagnosis not present

## 2017-12-04 DIAGNOSIS — M791 Myalgia, unspecified site: Secondary | ICD-10-CM

## 2017-12-04 LAB — GLUCOSE, POCT (MANUAL RESULT ENTRY): POC Glucose: 131 mg/dl — AB (ref 70–99)

## 2017-12-04 MED ORDER — PREDNISONE 20 MG PO TABS: ORAL_TABLET | ORAL | 0 refills | Status: DC

## 2017-12-04 NOTE — Patient Instructions (Addendum)
II checked a muscle blood test. If pain is not continuing to improve - fill the prescription for prednisone. If you do take the prednisone, return with a few days of starting it so we can recheck your blood sugar.  If neck pain is not continuing to improve in next 2 weeks, return for recheck.  Return to the clinic or go to the nearest emergency room if any of your symptoms worsen or new symptoms occur.  Blood pressure better today. Keep a record of your blood pressures outside of the office and if those levels are running over 140/90 - return to discuss possible medications.   IF you received an x-ray today, you will receive an invoice from [facility]. Please contact [facility] at [phone] with questions or concerns regarding your invoice.   IF you received labwork today, you will receive an invoice from [facility]. Please contact [facility] at [phone] with questions or concerns regarding your invoice.   Our billing staff will not be able to assist you with questions regarding bills from these companies.  You will be contacted with the lab results as soon as they are available. The fastest way to get your results is to activate your My Chart account. Instructions are located on the last page of this paperwork. If you have not heard from us regarding the results in 2 weeks, please contact this office.

## 2017-12-04 NOTE — Progress Notes (Signed)
Subjective:    Patient ID: Brad Simmons, male    DOB: 11/02/1961, 57 y.o.   MRN: [id number]  HPI Brad Simmons is a 57 y.o. male Presents today for: Chief Complaint  Patient presents with  . Arm Pain    Right arm, Follow up  Here for l follow-up of right arm pain. Was initially seen 11/04/2017 with right trapezius pain and right hand numbness. Treated with meloxicam, Flexeril. When I saw him 4 days ago he had lifted buckets 3 days prior then pain starting the following day in the right forearm with radiation to the right upper arm and shoulder. Minimal relief with Advil over-the-counter no recent Flexeril. Diagnosed with possible cervical radiculopathy. Recommended initial trial of Flexeril 5 mg 3 times a day, heat, ice and range of motion. NSAID only if blood pressure under 140/90(blood pressure 154/82 at that visit).   Taking flexeril 3 times per day. Pain about the same since last visit. Little better with heating pad. Has been doing exercises with bicycle with arm bars for 20-30 mins at at time. Worse burning pain in arm this morning. No relief with heating pad, but better with elevation of arm and cool water. Slight neck pain, better than last time. Pain into forearm at times - aches.   Elevated blood pressure: Has cut back on ibuprofen - none since last visit. No outside readings.  Glucose 107 in [place] 2018, normal A1c. Ate few hours ago.   Patient Active Problem List   Diagnosis Date Noted  . Erectile dysfunction 05/05/2017   No past medical history on file. No past surgical history on file. No Known Allergies Prior to Admission medications   Medication Sig Start Date End Date Taking? Authorizing Provider  cyclobenzaprine (FLEXERIL) 5 MG tablet 1 pill by mouth up to every 8 hours as needed. Start with one pill by mouth each bedtime as needed due to sedation 11/30/17  Yes Shade FloodGreene, Refujio Haymer R, MD  sildenafil (VIAGRA) 100 MG tablet Take 1 tablet (100 mg total) by mouth daily as needed for  erectile dysfunction. 05/05/17  Yes Doristine BosworthStallings, Zoe A, MD   Social History   Socioeconomic History  . Marital status: Single    Spouse name: Not on file  . Number of children: Not on file  . Years of education: Not on file  . Highest education level: Not on file  Social Needs  . Financial resource strain: Not on file  . Food insecurity - worry: Not on file  . Food insecurity - inability: Not on file  . Transportation needs - medical: Not on file  . Transportation needs - non-medical: Not on file  Occupational History  . Not on file  Tobacco Use  . Smoking status: Current Some Day Smoker    Types: Cigars  . Smokeless tobacco: Never Used  Substance and Sexual Activity  . Alcohol use: Yes    Alcohol/week: 1.8 oz    Types: 3 Cans of beer per week  . Drug use: No  . Sexual activity: Yes    Birth control/protection: Condom  Other Topics Concern  . Not on file  Social History Narrative  . Not on file    Review of Systems  Respiratory: Negative for shortness of breath.   Cardiovascular: Negative for chest pain.  Musculoskeletal: Positive for arthralgias (r arm pain, burning. ) and neck pain. Negative for neck stiffness (improved.).  Skin: Negative for rash.  Neurological: Negative for weakness.       Objective:  Physical Exam  Constitutional: He is oriented to person, place, and time. He appears well-developed and well-nourished.  HENT:  Head: Normocephalic and atraumatic.  Eyes: EOM are normal. Pupils are equal, round, and reactive to light.  Neck: No JVD present. Carotid bruit is not present.  Cardiovascular: Normal rate, regular rhythm and normal heart sounds.  No murmur heard. Pulmonary/Chest: Effort normal and breath sounds normal. He has no rales.  Musculoskeletal: He exhibits no edema.       Right elbow: He exhibits normal range of motion, no swelling and no deformity.       Cervical back: He exhibits decreased range of motion (Minimal decreased rotation  bilaterally, equal minimal discomfort right neck,).       Arms: Neurological: He is alert and oriented to person, place, and time.  Possible slight decreased biceps reflex, otherwise equal. Equal grip strength, equal biceps/triceps strength.  Skin: Skin is warm and dry.  Psychiatric: He has a normal mood and affect.  Vitals reviewed.  Vitals:   12/04/17 1141  BP: (!) 138/94  Pulse: 97  Resp: 18  Temp: 98.4 F (36.9 C)  TempSrc: Oral  SpO2: 98%  Weight: 225 lb (102.1 kg)  Height: 5' 10.9" (1.801 m)   Results for orders placed or performed in visit on 12/04/17  POCT glucose (manual entry)  Result Value Ref Range   POC Glucose 131 (A) 70 - 99 mg/dl   Dg Cervical Spine Complete  Result Date: 12/04/2017 CLINICAL DATA:  Right arm and neck pain.  Suspected radiculopathy. EXAM: CERVICAL SPINE - COMPLETE 4+ VIEW COMPARISON:  None. FINDINGS: Mild C3-4 and C5-6 disc narrowing. Left C3-4 uncovertebral ridging encroaching on the foramen. No evidence of foraminal stenosis on the symptomatic right side. No fracture deformity or prevertebral thickening. Clear apical lungs. IMPRESSION: 1. Mild disc narrowing at C3-4 and C5-6. 2. C3-4 left uncovertebral spurring and foraminal narrowing. 3. No bony foraminal narrowing on the symptomatic right side. Electronically Signed   By: Marnee Spring M.D.   On: 12/04/2017 12:53       Assessment & Plan:   Brad Simmons is a 57 y.o. male Right arm pain - Plan: DG Cervical Spine Complete, predniSONE (DELTASONE) 20 MG tablet Myalgia - Plan: CK Neck pain - Plan: DG Cervical Spine Complete, predniSONE (DELTASONE) 20 MG tablet  -Still possibile cervical radiculopathy, but overall some improvement. Did have worsening symptoms morning of visit but that has overall improved.  -Continue symptomatic care with muscle relaxant, and if continuing to improve, no further medications may be needed. If not improving, or has worsening symptoms as morning of visit, start  prednisone.  -mild hyperglycemia on labs but not fasting. If he does start prednisone, recommended follow-up to ensure glucose stability.  -RTC precautions if worse  Screening for diabetes mellitus - Plan: POCT glucose (manual entry)  Elevated blood pressure reading  -Improved from last visit. Continue to monitor outside readings, avoid NSAIDs for now. RTC precautions  Meds ordered this encounter  Medications  . predniSONE (DELTASONE) 20 MG tablet    Sig: 3 by mouth for 3 days, then 2 by mouth for 2 days, then 1 by mouth for 2 days, then 1/2 by mouth for 2 days.    Dispense:  16 tablet    Refill:  0   Patient Instructions   II checked a muscle blood test. If pain is not continuing to improve - fill the prescription for prednisone. If you do take the prednisone, return with a  few days of starting it so we can recheck your blood sugar.  If neck pain is not continuing to improve in next 2 weeks, return for recheck.  Return to the clinic or go to the nearest emergency room if any of your symptoms worsen or new symptoms occur.  Blood pressure better today. Keep a record of your blood pressures outside of the office and if those levels are running over 140/90 - return to discuss possible medications.   IF you received an x-ray today, you will receive an invoice from [facility]. Please contact [facility] at [phone] with questions or concerns regarding your invoice.   IF you received labwork today, you will receive an invoice from [place]. Please contact [facility] at [phone] with questions or concerns regarding your invoice.   Our billing staff will not be able to assist you with questions regarding bills from these companies.  You will be contacted with the lab results as soon as they are available. The fastest way to get your results is to activate your My Chart account. Instructions are located on the last page of this paperwork. If you have not heard from [place]  regarding the results in 2 weeks, please contact this office.      Signed,   Meredith Staggers, MD Primary Care at [facility].  12/05/17 11:29 AM

## 2017-12-05 ENCOUNTER — Encounter: Payer: Self-pay | Admitting: Family Medicine

## 2017-12-05 LAB — CK: Total CK: 135 U/L (ref 24–204)

## 2017-12-07 NOTE — Progress Notes (Signed)
Spoke with pt, [facility]

## 2017-12-08 ENCOUNTER — Other Ambulatory Visit: Payer: Self-pay

## 2017-12-08 ENCOUNTER — Encounter: Payer: Self-pay | Admitting: Family Medicine

## 2017-12-08 ENCOUNTER — Ambulatory Visit: Payer: BLUE CROSS/BLUE SHIELD | Admitting: Family Medicine

## 2017-12-08 VITALS — BP 136/82 | HR 104 | Temp 97.5°F | Resp 16 | Ht 70.9 in | Wt 230.2 lb

## 2017-12-08 DIAGNOSIS — R030 Elevated blood-pressure reading, without diagnosis of hypertension: Secondary | ICD-10-CM | POA: Diagnosis not present

## 2017-12-08 DIAGNOSIS — M79601 Pain in right arm: Secondary | ICD-10-CM | POA: Diagnosis not present

## 2017-12-08 DIAGNOSIS — M5412 Radiculopathy, cervical region: Secondary | ICD-10-CM | POA: Diagnosis not present

## 2017-12-08 DIAGNOSIS — M62838 Other muscle spasm: Secondary | ICD-10-CM

## 2017-12-08 MED ORDER — CYCLOBENZAPRINE HCL 5 MG PO TABS: ORAL_TABLET | ORAL | 0 refills | Status: DC

## 2017-12-08 NOTE — Patient Instructions (Addendum)
  If pain continues to improve, can decrease use of flexeril (down to once per day or just as needed).  If pain returns, and not improved with flexeril, then can start prednisone that was printed last visit.   IF you start the prednisone, then return within a few days to check blood sugar on that med.   Keep a record of your blood pressures outside of the office and if running over 140/90, then return to discuss medicine.   Follow up with Dr. Creta LevinStallings for physical in [place].   Return to the clinic or go to the nearest emergency room if any of your symptoms worsen or new symptoms occur.   IF you received an x-ray today, you will receive an invoice from [facility]. Please contact [facility] at [phone] with questions or concerns regarding your invoice.   IF you received labwork today, you will receive an invoice from [facility]. Please contact [facility] at [phone] with questions or concerns regarding your invoice.   Our billing staff will not be able to assist you with questions regarding bills from these companies.  You will be contacted with the lab results as soon as they are available. The fastest way to get your results is to activate your My Chart account. Instructions are located on the last page of this paperwork. If you have not heard from us regarding the results in 2 weeks, please contact this office.

## 2017-12-08 NOTE — Progress Notes (Signed)
Subjective:  This chart was scribed for Shade FloodGreene, Meyer Dockery R, MD by Veverly FellsHatice Demirci,scribe, at [facility].  This patient was seen in room 9 and the patient's care was started at 9:42 AM.   Chief Complaint  Patient presents with  . Arm Pain    patient presents for follow up on arm pain. Patient using the prednisone, states that it has helped the pain     Patient ID: Brad Simmons, male    DOB: 1961-05-01, 57 y.o.   MRN: [id number]  HPI HPI Comments: Brad Simmons is a 57 y.o. male who [facility] for a follow up regarding right arm pain.  Last seen four days ago.  He was overall improving last visit, then worsening symptoms that morning. Discussed continue symptomatic care with muscle relaxant but option of prednisone if not continuing to improve. He did have mild hyperglycemia so recommended follow up if he started prednisone to monitor glucose. --- Patient states that he has not started the prednisone.  He still has some soreness in his arm but it is "90%" better.  He has been taking the Flexeril 3 times per day and denies any side effects.   Elevated blood pressure: see last visits.  Was improved at last visit. Not on medication at present.    Patient Active Problem List   Diagnosis Date Noted  . Erectile dysfunction 05/05/2017   No past medical history on file. No past surgical history on file. No Known Allergies Prior to Admission medications   Medication Sig Start Date End Date Taking? Authorizing Provider  cyclobenzaprine (FLEXERIL) 5 MG tablet 1 pill by mouth up to every 8 hours as needed. Start with one pill by mouth each bedtime as needed due to sedation 11/30/17   Shade FloodGreene, Joell Buerger R, MD  predniSONE (DELTASONE) 20 MG tablet 3 by mouth for 3 days, then 2 by mouth for 2 days, then 1 by mouth for 2 days, then 1/2 by mouth for 2 days. 12/04/17   Shade FloodGreene, Cartel Mauss R, MD  sildenafil (VIAGRA) 100 MG tablet Take 1 tablet (100 mg total) by mouth daily as needed for  erectile dysfunction. 05/05/17   Doristine BosworthStallings, Zoe A, MD   Social History   Socioeconomic History  . Marital status: Single    Spouse name: Not on file  . Number of children: Not on file  . Years of education: Not on file  . Highest education level: Not on file  Social Needs  . Financial resource strain: Not on file  . Food insecurity - worry: Not on file  . Food insecurity - inability: Not on file  . Transportation needs - medical: Not on file  . Transportation needs - non-medical: Not on file  Occupational History  . Not on file  Tobacco Use  . Smoking status: Current Some Day Smoker    Types: Cigars  . Smokeless tobacco: Never Used  Substance and Sexual Activity  . Alcohol use: Yes    Alcohol/week: 1.8 oz    Types: 3 Cans of beer per week  . Drug use: No  . Sexual activity: Yes    Birth control/protection: Condom  Other Topics Concern  . Not on file  Social History Narrative  . Not on file    Review of Systems  Constitutional: Negative for chills and fever.  Eyes: Negative for pain, redness and itching.  Respiratory: Negative for cough, choking and shortness of breath.   Gastrointestinal: Negative for diarrhea, nausea  and vomiting.  Musculoskeletal: Negative for neck pain and neck stiffness.       Objective:   Physical Exam  Constitutional: He is oriented to person, place, and time. He appears well-developed and well-nourished. No distress.  HENT:  Head: Normocephalic and atraumatic.  Eyes: Pupils are equal, round, and reactive to light.  Pulmonary/Chest: No respiratory distress.  Musculoskeletal:  Cervical spine exam: Slight decrease extension. Equal rotation, equal lateral flexion. Full ROM at the right shoulder. Full rotator cuff strength, pain free range of motion, no focal tenderness.   Neurological: He is alert and oriented to person, place, and time.  Skin: Skin is warm and dry.   Vitals:   12/08/17 0928  BP: 136/82  Pulse: (!) 104  Resp: 16  Temp:  (!) 97.5 F (36.4 C)  TempSrc: Oral  SpO2: 99%  Weight: 230 lb 3.2 oz (104.4 kg)  Height: 5' 10.9" (1.801 m)       Assessment & Plan:  Brad Simmons is a 57 y.o. male Elevated blood pressure reading  - now improved. Likely had some component of elevated pressure with pain. Advised to continue to monitor outside of office and if over 140/90, return for recheck. Smoking cessation was also discussed.  Right cervical radiculopathy Right arm pain  -improved. Okay to continue Flexeril on an as-needed basis, but tapering discussed. Did not require prednisone. If he does have worsening and does require prednisone, follow-up for glucose testing. Otherwise follow-up with primary care provider in the next 5-6 months for physical.  No orders of the defined types were placed in this encounter.  Patient Instructions    If pain continues to improve, can decrease use of flexeril (down to once per day or just as needed).  If pain returns, and not improved with flexeril, then can start prednisone that was printed last visit.   IF you start the prednisone, then return within a few days to check blood sugar on that med.   Keep a record of your blood pressures outside of the office and if running over 140/90, then return to discuss medicine.   Follow up with Dr. Creta Levin for physical in [place].   Return to the clinic or go to the nearest emergency room if any of your symptoms worsen or new symptoms occur.   IF you received an x-ray today, you will receive an invoice from [facility]. Please contact [facility] at [phone] with questions or concerns regarding your invoice.   IF you received labwork today, you will receive an invoice from [place]. Please contact [facility] at [phone] with questions or concerns regarding your invoice.   Our billing staff will not be able to assist you with questions regarding bills from these companies.  You will be contacted with the lab  results as soon as they are available. The fastest way to get your results is to activate your My Chart account. Instructions are located on the last page of this paperwork. If you have not heard from [place] regarding the results in 2 weeks, please contact this office.      I personally performed the services described in this documentation, which was scribed in my presence. The recorded information has been reviewed and considered for accuracy and completeness, addended by me as needed, and agree with information above.  Signed,   Meredith Staggers, MD Primary Care at [facility].  12/08/17 9:52 AM

## 2018-02-18 ENCOUNTER — Telehealth: Payer: Self-pay | Admitting: Family Medicine

## 2018-02-18 NOTE — Telephone Encounter (Signed)
Called pt to try and reschedule his CPE with Stallings on 05/03/18. He was at work and did not have time to reschedule. I advised him that I would cancel the appt and he would need to call the office and reschedule.

## 2018-05-03 ENCOUNTER — Encounter: Payer: BLUE CROSS/BLUE SHIELD | Admitting: Family Medicine

## 2018-12-17 ENCOUNTER — Encounter: Payer: Self-pay | Admitting: Family Medicine

## 2019-02-24 ENCOUNTER — Encounter: Payer: BLUE CROSS/BLUE SHIELD | Admitting: Family Medicine

## 2019-02-24 ENCOUNTER — Other Ambulatory Visit: Payer: Self-pay

## 2019-03-08 ENCOUNTER — Telehealth (INDEPENDENT_AMBULATORY_CARE_PROVIDER_SITE_OTHER): Payer: Self-pay | Admitting: Family Medicine

## 2019-03-08 ENCOUNTER — Other Ambulatory Visit: Payer: Self-pay

## 2019-03-08 ENCOUNTER — Encounter: Payer: Self-pay | Admitting: Family Medicine

## 2019-03-08 VITALS — BP 126/81 | HR 88 | Temp 98.3°F | Resp 17 | Ht 71.0 in | Wt 244.6 lb

## 2019-03-08 DIAGNOSIS — E041 Nontoxic single thyroid nodule: Secondary | ICD-10-CM

## 2019-03-08 DIAGNOSIS — Z1329 Encounter for screening for other suspected endocrine disorder: Secondary | ICD-10-CM

## 2019-03-08 DIAGNOSIS — Z13220 Encounter for screening for lipoid disorders: Secondary | ICD-10-CM

## 2019-03-08 DIAGNOSIS — Z6834 Body mass index (BMI) 34.0-34.9, adult: Secondary | ICD-10-CM

## 2019-03-08 DIAGNOSIS — Z131 Encounter for screening for diabetes mellitus: Secondary | ICD-10-CM

## 2019-03-08 DIAGNOSIS — E6609 Other obesity due to excess calories: Secondary | ICD-10-CM

## 2019-03-08 DIAGNOSIS — N529 Male erectile dysfunction, unspecified: Secondary | ICD-10-CM

## 2019-03-08 DIAGNOSIS — Z136 Encounter for screening for cardiovascular disorders: Secondary | ICD-10-CM

## 2019-03-08 MED ORDER — SILDENAFIL CITRATE 100 MG PO TABS: 100.0000 mg | ORAL_TABLET | Freq: Every day | ORAL | 11 refills | Status: DC | PRN

## 2019-03-08 NOTE — Progress Notes (Signed)
CC: thyroid check.  No concerns.  No travel outside the [place] or [place] in the past 3 weeks.

## 2019-03-08 NOTE — Patient Instructions (Addendum)
If you have lab work done today you will be contacted with your lab results within the next 2 weeks.  If you have not heard from us then please contact us. The fastest way to get your results is to register for My Chart.   IF you received an x-ray today, you will receive an invoice from [facility]. Please contact [facility] at [phone] with questions or concerns regarding your invoice.   IF you received labwork today, you will receive an invoice from [facility]. Please contact [facility] at [phone] with questions or concerns regarding your invoice.   Our billing staff will not be able to assist you with questions regarding bills from these companies.  You will be contacted with the lab results as soon as they are available. The fastest way to get your results is to activate your My Chart account. Instructions are located on the last page of this paperwork. If you have not heard from us regarding the results in 2 weeks, please contact this office.     Thyroid Nodule  A thyroid nodule is an isolatedgrowth of thyroid cells that forms a lump in your thyroid gland. The thyroid gland is a butterfly-shaped gland. It is found in the lower front of your neck. This gland sends chemical messengers (hormones) through your blood to all parts of your body. These hormones are important in regulating your body temperature and helping your body to use energy. Thyroid nodules are common. Most are not cancerous (are benign). You may have one nodule or several nodules. Different types of thyroid nodules include:  Nodules that grow and fill with fluid (thyroid cysts).  Nodules that produce too much thyroid hormone (hot nodules or hyperthyroid).  Nodules that produce no thyroid hormone (cold nodules or hypothyroid).  Nodules that form from cancer cells (thyroid cancers). What are the causes? Usually, the cause of this condition is not known. What increases the risk? Factors  that make this condition more likely to develop include:  Increasing age. Thyroid nodules become more common in people who are older than 58 years of age.  Gender. ? Benign thyroid nodules are more common in women. ? Cancerous (malignant) thyroid nodules are more common in men.  A family history that includes: ? Thyroid nodules. ? Pheochromocytoma. ? Thyroid carcinoma. ? Hyperparathyroidism.  Certain kinds of thyroid diseases, such as Hashimoto thyroiditis.  Lack of iodine.  A history of head and neck radiation, such as from X-rays. What are the signs or symptoms? It is common for this condition to cause no symptoms. If you have symptoms, they may include:  A lump in your lower neck.  Feeling a lump or tickle in your throat.  Pain in your neck, jaw, or ear.  Having trouble swallowing. Hot nodules may cause symptoms that include:  Weight loss.  Warm, flushed skin.  Feeling hot.  Feeling nervous.  A racing heartbeat. Cold nodules may cause symptoms that include:  Weight gain.  Dry skin.  Brittle hair. This may also occur with hair loss.  Feeling cold.  Fatigue. Thyroid cancer nodules may cause symptoms that include:  Hard nodules that feel stuck to the thyroid gland.  Hoarseness.  Lumps in the glands near your thyroid (lymph nodes). How is this diagnosed? A thyroid nodule may be felt by your health care provider during a physical exam. This condition may also be diagnosed based on your symptoms. You may also have tests, including:  An ultrasound. This may be done  to confirm the diagnosis.  A biopsy. This involves taking a sample from the nodule and looking at it under a microscope to see if the nodule is benign.  Blood tests to make sure that your thyroid is working properly.  Imaging tests such as MRI or CT scan may be done if: ? Your nodule is large. ? Your nodule is blocking your airway. ? Cancer is suspected. How is this treated? Treatment  depends on the cause and size of your nodule or nodules. If the nodule is benign, treatment may not be necessary. Your health care provider may monitor the nodule to see if it goes away without treatment. If the nodule continues to grow, is cancerous, or does not go away:  It may need to be drained with a needle.  It may need to be removed with surgery. If you have surgery, part or all of your thyroid gland may need to be removed as well. Follow these instructions at home:  Pay attention to any changes in your nodule.  Take over-the-counter and prescription medicines only as told by your health care provider.  Keep all follow-up visits as told by your health care provider. This is important. Contact a health care provider if:  Your voice changes.  You have trouble swallowing.  You have pain in your neck, ear, or jaw that is getting worse.  Your nodule gets bigger.  Your nodule starts to make it harder for you to breathe. Get help right away if:  You have a sudden fever.  You feel very weak.  Your muscles look like they are shrinking (muscle wasting).  You have mood swings.  You feel very restless.  You feel confused.  You are seeing or hearing things that other people do not see or hear (having hallucinations).  You feel suddenly nauseous or throw up.  You suddenly have diarrhea.  You have chest pain.  There is a loss of consciousness. This information is not intended to replace advice given to you by your health care provider. Make sure you discuss any questions you have with your health care provider. Document Released: 09/19/2004 Document Revised: 06/29/2016 Document Reviewed: 02/07/2015 Elsevier Interactive Patient Education  2019 [employer].

## 2019-03-08 NOTE — Progress Notes (Signed)
CC: thyroid nodule on MRI  HPI   Pt had an incidental finding of a 2.5cm nodule on the left thyroid gland noted on an MRI cervical dated 12/17/2018 He is here because his Orthopedic specialist recommended follow up.  In his family there has been no issues with thyroid disease or thyroid cancer He denies issues with swallowing He denies fatigue, temperature issues, skin changes or memory changes  Lab Results  Component Value Date   CHOL 168 05/05/2017   Lab Results  Component Value Date   HDL 38 (L) 05/05/2017   Lab Results  Component Value Date   LDLCALC 114 (H) 05/05/2017   Lab Results  Component Value Date   TRIG 80 05/05/2017   Lab Results  Component Value Date   CHOLHDL 4.4 05/05/2017    No results found for: LDLDIRECT   No past medical history on file.  Current Outpatient Medications  Medication Sig Dispense Refill  . sildenafil (VIAGRA) 100 MG tablet Take 1 tablet (100 mg total) by mouth daily as needed for erectile dysfunction. 5 tablet 11   No current facility-administered medications for this visit.     Allergies: No Known Allergies  No past surgical history on file.  Social History   Socioeconomic History  . Marital status: Single    Spouse name: Not on file  . Number of children: Not on file  . Years of education: Not on file  . Highest education level: Not on file  Occupational History  . Not on file  Social Needs  . Financial resource strain: Not on file  . Food insecurity:    Worry: Not on file    Inability: Not on file  . Transportation needs:    Medical: Not on file    Non-medical: Not on file  Tobacco Use  . Smoking status: Current Some Day Smoker    Types: Cigars  . Smokeless tobacco: Never Used  Substance and Sexual Activity  . Alcohol use: Yes    Alcohol/week: 3.0 standard drinks    Types: 3 Cans of beer per week  . Drug use: No  . Sexual activity: Yes    Birth control/protection: Condom  Lifestyle  . Physical  activity:    Days per week: Not on file    Minutes per session: Not on file  . Stress: Not on file  Relationships  . Social connections:    Talks on phone: Not on file    Gets together: Not on file    Attends religious service: Not on file    Active member of club or organization: Not on file    Attends meetings of clubs or organizations: Not on file    Relationship status: Not on file  Other Topics Concern  . Not on file  Social History Narrative  . Not on file    No family history on file.   ROS Review of Systems See HPI Constitution: No fevers or chills No malaise No diaphoresis Skin: No rash or itching Eyes: no blurry vision, no double vision GU: no dysuria or hematuria Neuro: no dizziness or headaches all others reviewed and negative   Objective: Vitals:   03/08/19 1127  BP: 126/81  Pulse: 88  Resp: 17  Temp: 98.3 F (36.8 C)  TempSrc: Oral  SpO2: 98%  Weight: 244 lb 9.6 oz (110.9 kg)  Height: 5\' 11"  (1.803 m)    Physical Exam Physical Exam  Constitutional: Oriented to person, place, and time. Appears well-developed and  well-nourished.  HENT:  Head: Normocephalic and atraumatic.  Eyes: Conjunctivae and EOM are normal.  Cardiovascular: Normal rate, regular rhythm, normal heart sounds and intact distal pulses.  No murmur heard. Pulmonary/Chest: Effort normal and breath sounds normal. No stridor. No respiratory distress. Has no wheezes.  Neurological: Is alert and oriented to person, place, and time.  Skin: Skin is warm. Capillary refill takes less than 2 seconds.  Psychiatric: Has a normal mood and affect. Behavior is normal. Judgment and thought content normal.   Assessment and Plan Diagnoses and all orders for this visit:  Thyroid nodule- will refer for evaluation of the this incidentaloma Discussed that since he is asymptomatic we could track it with images or a biopsy might need to be taken  Will defer to Endocrinology -     Ambulatory referral  to Endocrinology  Screening for thyroid disorder -     TSH  Encounter for lipid screening for cardiovascular disease- discussed lipid panel -     Lipid panel  Screening for diabetes mellitus -     Comprehensive metabolic panel  Class 1 obesity due to excess calories without serious comorbidity with body mass index (BMI) of 34.0 to 34.9 in adult-  Discussed resuming exercise, continue PT for the neck  Erectile dysfunction: prn viagra continued  -     sildenafil (VIAGRA) 100 MG tablet; Take 1 tablet (100 mg total) by mouth daily as needed for erectile dysfunction.     Zoe A Stallings

## 2019-03-09 LAB — COMPREHENSIVE METABOLIC PANEL
ALT: 41 IU/L (ref 0–44)
AST: 19 IU/L (ref 0–40)
Albumin/Globulin Ratio: 1.8 (ref 1.2–2.2)
Albumin: 4.5 g/dL (ref 3.8–4.9)
Alkaline Phosphatase: 69 IU/L (ref 39–117)
BUN/Creatinine Ratio: 9 (ref 9–20)
BUN: 11 mg/dL (ref 6–24)
Bilirubin Total: 0.7 mg/dL (ref 0.0–1.2)
CO2: 24 mmol/L (ref 20–29)
Calcium: 9.6 mg/dL (ref 8.7–10.2)
Chloride: 101 mmol/L (ref 96–106)
Creatinine, Ser: 1.16 mg/dL (ref 0.76–1.27)
GFR calc Af Amer: 80 mL/min/{1.73_m2} (ref >59)
GFR calc non Af Amer: 70 mL/min/{1.73_m2} (ref >59)
Globulin, Total: 2.5 g/dL (ref 1.5–4.5)
Glucose: 166 mg/dL — ABNORMAL HIGH (ref 65–99)
Potassium: 4.7 mmol/L (ref 3.5–5.2)
Sodium: 140 mmol/L (ref 134–144)
Total Protein: 7 g/dL (ref 6.0–8.5)

## 2019-03-09 LAB — LIPID PANEL
Chol/HDL Ratio: 5.6 ratio — ABNORMAL HIGH (ref 0.0–5.0)
Cholesterol, Total: 185 mg/dL (ref 100–199)
HDL: 33 mg/dL — ABNORMAL LOW (ref >39)
LDL Calculated: 122 mg/dL — ABNORMAL HIGH (ref 0–99)
Triglycerides: 148 mg/dL (ref 0–149)
VLDL Cholesterol Cal: 30 mg/dL (ref 5–40)

## 2019-03-09 LAB — TSH: TSH: 1.45 u[IU]/mL (ref 0.450–4.500)

## 2019-03-10 ENCOUNTER — Ambulatory Visit: Payer: Self-pay

## 2019-03-14 NOTE — Progress Notes (Signed)
No vm set up. 

## 2019-03-23 NOTE — Progress Notes (Signed)
No show  This encounter was created in error - please disregard.

## 2019-03-25 IMAGING — DX DG CERVICAL SPINE COMPLETE 4+V
5 series · 5 of 5 positions shown · non-contrast
Comparison: None.

CLINICAL DATA: Right arm and neck pain.  Suspected radiculopathy.

EXAM:
CERVICAL SPINE - COMPLETE 4+ VIEW

[c-spine lat]
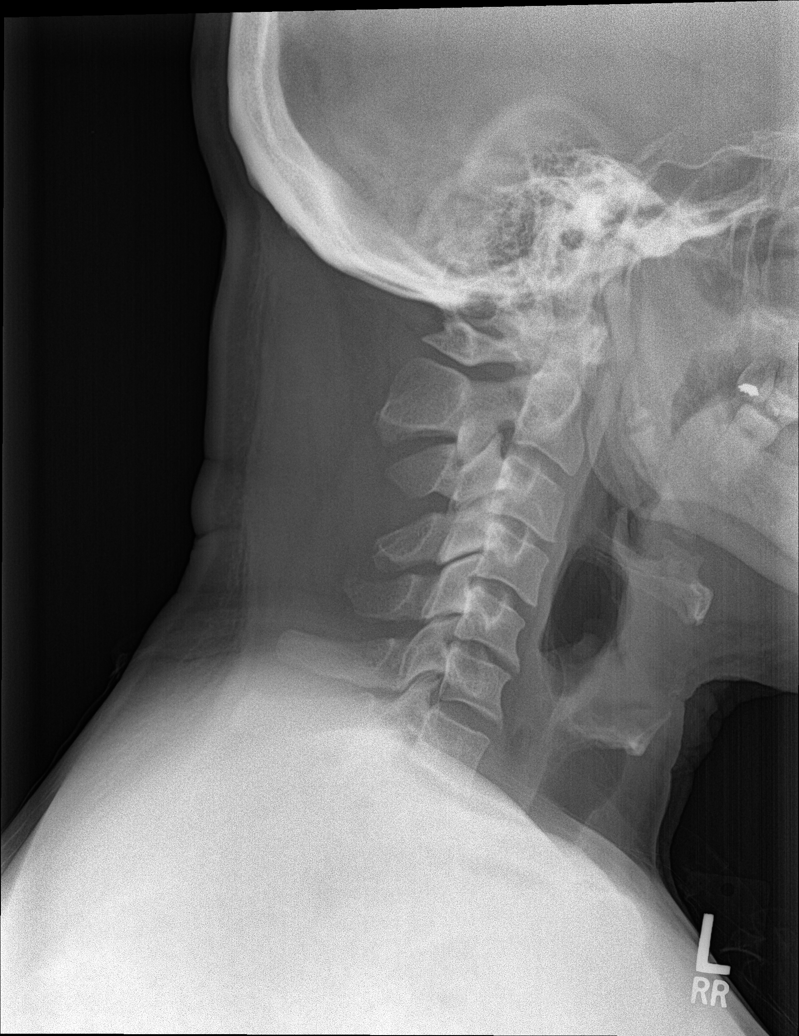

[c-spine obl (1 of 2)]
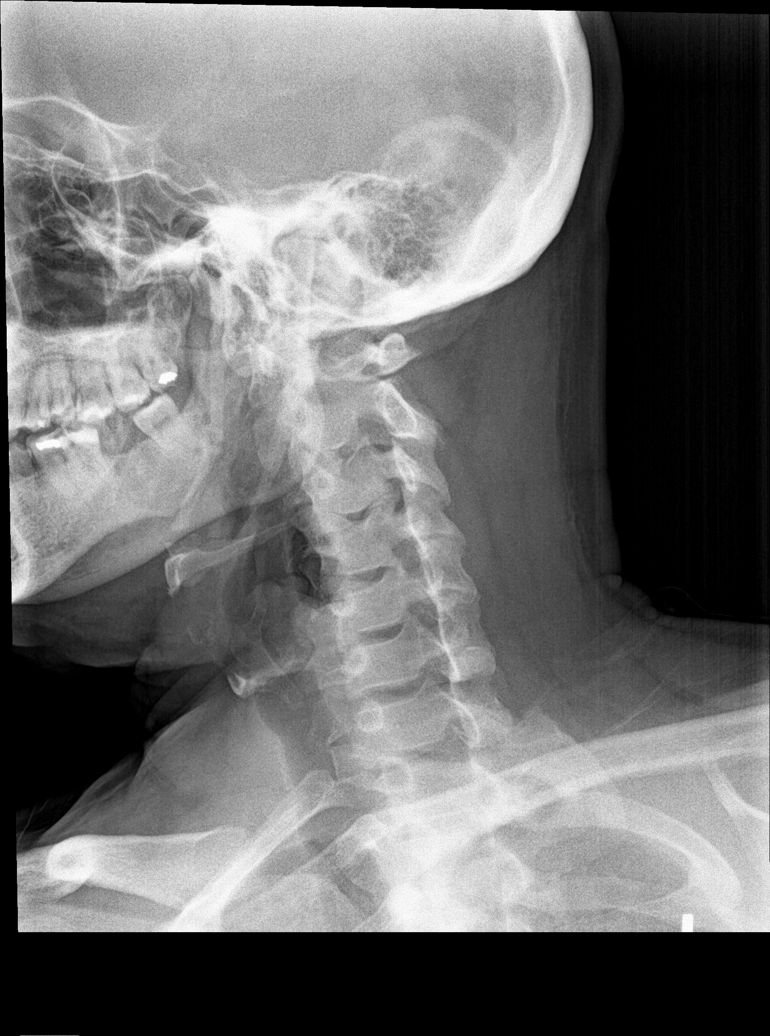

[c-spine obl (2 of 2)]
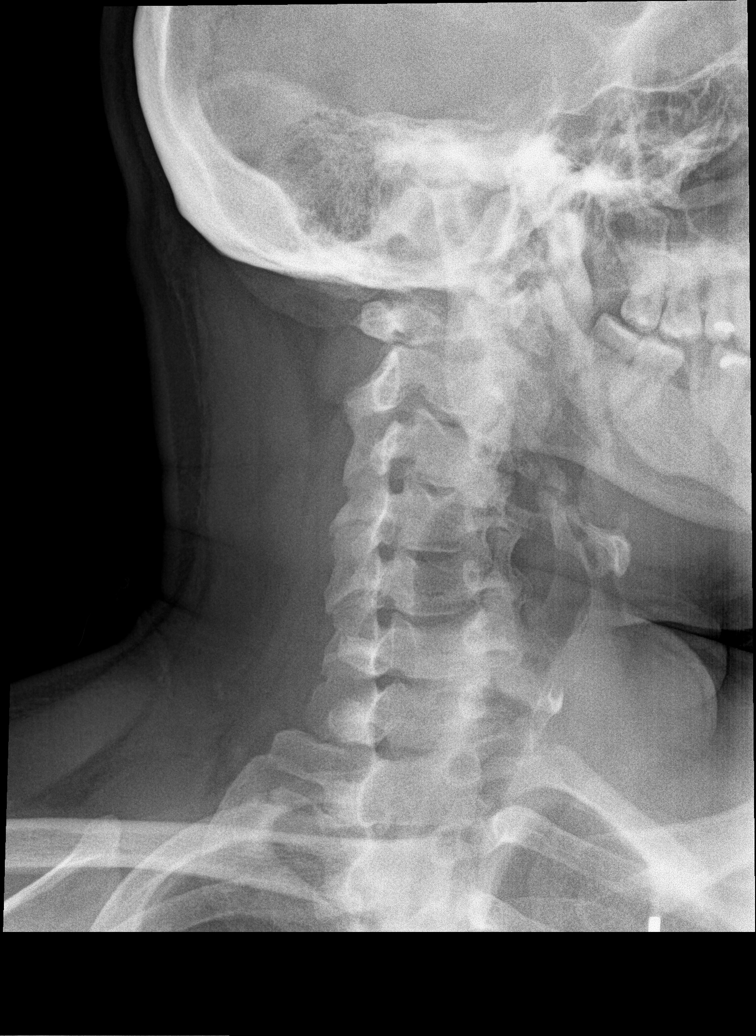

[c-spine ap]
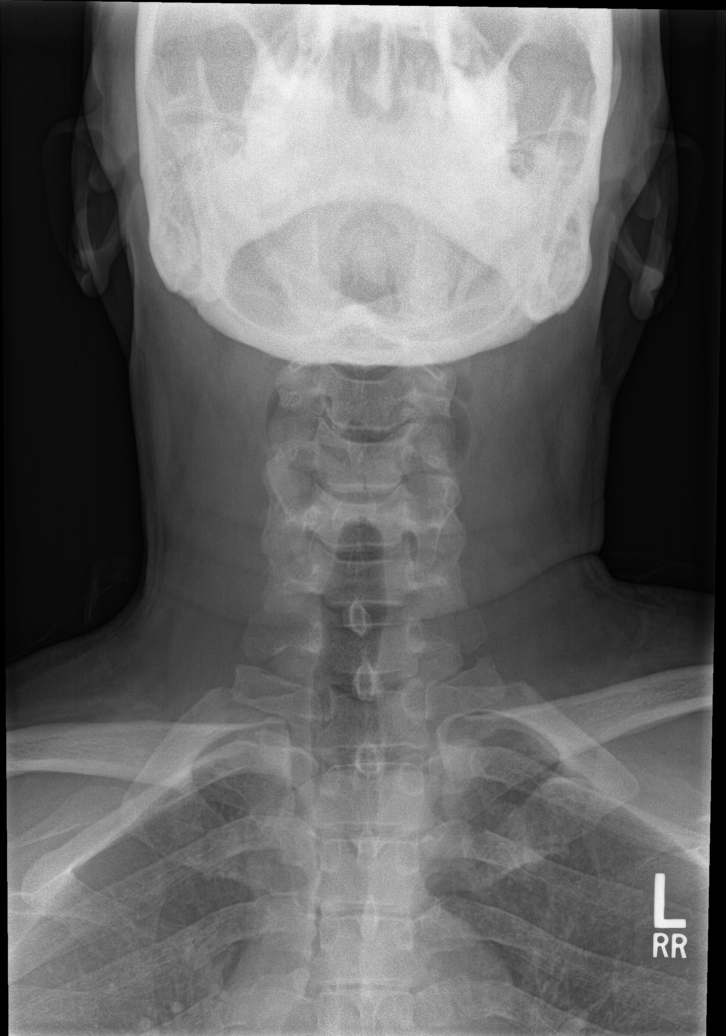

[c-spine open mouth]
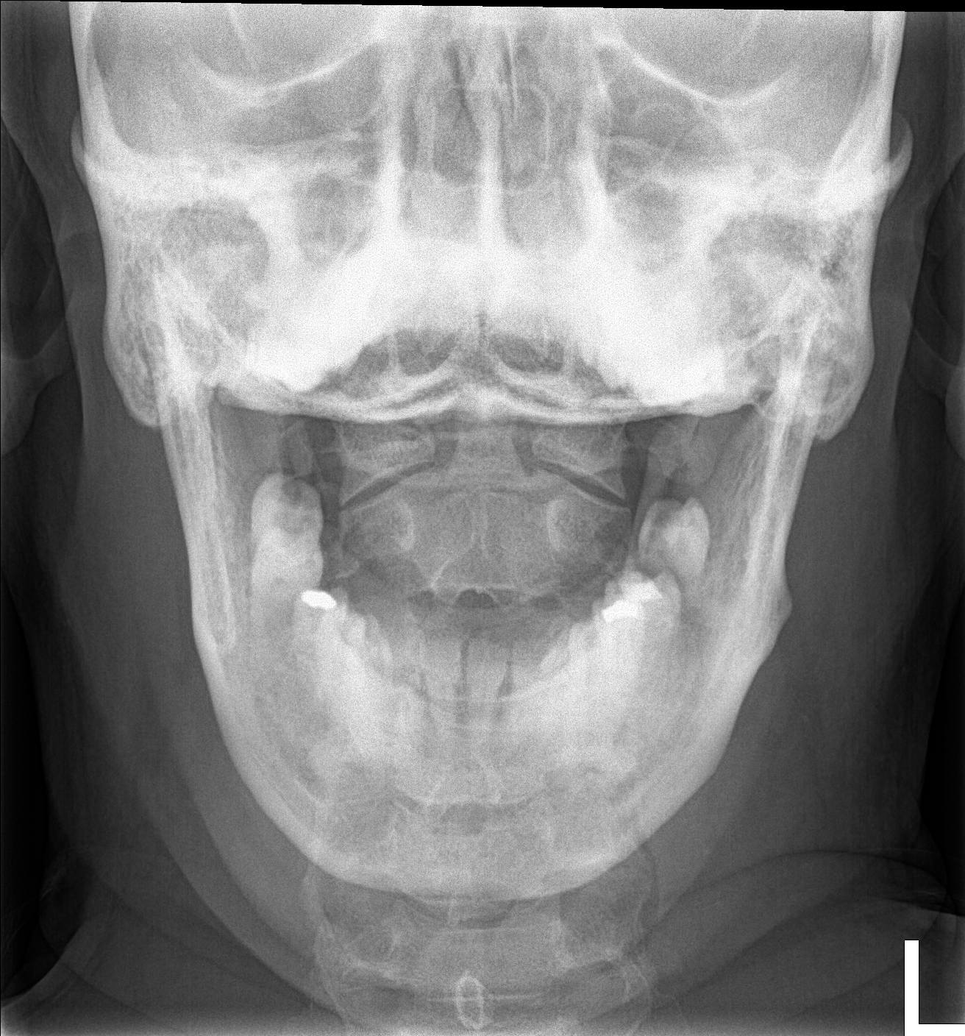

[5 of 5 positions shown; findings below may reference images not displayed]

FINDINGS: Mild C3-4 and C5-6 disc narrowing. Left C3-4 uncovertebral ridging
encroaching on the foramen. No evidence of foraminal stenosis on the
symptomatic right side. No fracture deformity or prevertebral
thickening. Clear apical lungs.
IMPRESSION: 1. Mild disc narrowing at C3-4 and C5-6.
2. C3-4 left uncovertebral spurring and foraminal narrowing.
3. No bony foraminal narrowing on the symptomatic right side.

## 2019-04-27 ENCOUNTER — Ambulatory Visit: Payer: BLUE CROSS/BLUE SHIELD | Admitting: Internal Medicine

## 2019-04-27 NOTE — Progress Notes (Deleted)
Name: Brad Simmons  MRN/ DOB: [id number], [birth year]    Age/ Sex: 58 y.o., male    PCP: Doristine BosworthStallings, Zoe A, MD   Reason for Endocrinology Evaluation: Thyroid nodule     Date of Initial Endocrinology Evaluation: 04/27/2019     HPI: [name] is a 58 y.o. male with a past medical history of ***. The patient presented for initial endocrinology clinic visit on 04/27/2019 for consultative assistance with his ***.   ***  HISTORY:   Past Medical History: No past medical history on file.  Past Surgical History: No past surgical history on file.   Social History:  reports that he has been smoking cigars. He has never used smokeless tobacco. He reports current alcohol use of about 3.0 standard drinks of alcohol per week. He reports that he does not use drugs.  Family History: family history is not on file.   HOME MEDICATIONS: Allergies as of 04/27/2019   No Known Allergies     Medication List       Accurate as of Zacary 17, 2020 12:05 PM. If you have any questions, ask your nurse or doctor.        sildenafil 100 MG tablet Commonly known as: VIAGRA Take 1 tablet (100 mg total) by mouth daily as needed for erectile dysfunction.         REVIEW OF SYSTEMS: A comprehensive ROS was conducted with the patient and is negative except as per HPI and below:  ROS     OBJECTIVE:  VS: There were no vitals taken for this visit.   Wt Readings from Last 3 Encounters:  03/08/19 244 lb 9.6 oz (110.9 kg)  12/08/17 230 lb 3.2 oz (104.4 kg)  12/04/17 225 lb (102.1 kg)     EXAM: General: Pt appears well and is in NAD  Hydration: Well-hydrated with moist mucous membranes and good skin turgor  Eyes: External eye exam normal without stare, lid lag or exophthalmos.  EOM intact.  PERRL.  Ears, Nose, Throat: Hearing: Grossly intact bilaterally Dental: Good dentition  Throat: Clear without mass, erythema or exudate  Neck: General: Supple without adenopathy. Thyroid: Thyroid size  normal.  No goiter or nodules appreciated. No thyroid bruit.  Lungs: Clear with good BS bilat with no rales, rhonchi, or wheezes  Heart: Auscultation: RRR.  Abdomen: Normoactive bowel sounds, soft, nontender, without masses or organomegaly palpable  Extremities: Gait and station: Normal gait  Digits and nails: No clubbing, cyanosis, petechiae, or nodes Head and neck: Normal alignment and mobility BL UE: Normal ROM and strength. BL LE: No pretibial edema normal ROM and strength.  Skin: Hair: Texture and amount normal with gender appropriate distribution Skin Inspection: No rashes, acanthosis nigricans/skin tags. No lipohypertrophy Skin Palpation: Skin temperature, texture, and thickness normal to palpation  Neuro: Cranial nerves: II - XII grossly intact  Cerebellar: Normal coordination and movement; no tremor Motor: Normal strength throughout DTRs: 2+ and symmetric in UE without delay in relaxation phase  Mental Status: Judgment, insight: Intact Orientation: Oriented to time, place, and person Memory: Intact for recent and remote events Mood and affect: No depression, anxiety, or agitation     DATA REVIEWED: ***    ASSESSMENT/PLAN/RECOMMENDATIONS:   1. ***    Medications :  Signed electronically by: Lyndle HerrlichAbby Jaralla Shamleffer, MD  [facility]  [facility] [address] [place], [place] [address] Phone: [phone] FAX: [phone]   CC: Doristine BosworthStallings, Zoe A, MD [address] [place] [address] Phone: [phone] Fax: [phone]   Return to Endocrinology clinic as below: Future Appointments  Date Time Provider [place]  04/27/2019  2:20 PM Shamleffer, Melanie Crazier, MD [facility] None

## 2020-01-11 ENCOUNTER — Other Ambulatory Visit: Payer: Self-pay

## 2020-01-11 ENCOUNTER — Ambulatory Visit (INDEPENDENT_AMBULATORY_CARE_PROVIDER_SITE_OTHER): Payer: BC Managed Care – PPO | Admitting: Family Medicine

## 2020-01-11 VITALS — BP 142/88 | HR 94 | Temp 98.2°F | Resp 16 | Ht 71.0 in | Wt 239.0 lb

## 2020-01-11 DIAGNOSIS — Z125 Encounter for screening for malignant neoplasm of prostate: Secondary | ICD-10-CM

## 2020-01-11 DIAGNOSIS — N529 Male erectile dysfunction, unspecified: Secondary | ICD-10-CM

## 2020-01-11 DIAGNOSIS — E6609 Other obesity due to excess calories: Secondary | ICD-10-CM

## 2020-01-11 DIAGNOSIS — Z6833 Body mass index (BMI) 33.0-33.9, adult: Secondary | ICD-10-CM | POA: Diagnosis not present

## 2020-01-11 MED ORDER — SILDENAFIL CITRATE 100 MG PO TABS: 100.0000 mg | ORAL_TABLET | Freq: Every day | ORAL | 11 refills | Status: DC | PRN

## 2020-01-11 NOTE — Patient Instructions (Addendum)
Continue your current medicines and exercise.     If you have lab work done today you will be contacted with your lab results within the next 2 weeks.  If you have not heard from [place] then please contact us. The fastest way to get your results is to register for My Chart.   IF you received an x-ray today, you will receive an invoice from [facility]. Please contact [facility] at [phone] with questions or concerns regarding your invoice.   IF you received labwork today, you will receive an invoice from [facility]. Please contact [facility] at [phone] with questions or concerns regarding your invoice.   Our billing staff will not be able to assist you with questions regarding bills from these companies.  You will be contacted with the lab results as soon as they are available. The fastest way to get your results is to activate your My Chart account. Instructions are located on the last page of this paperwork. If you have not heard from [place] regarding the results in 2 weeks, please contact this office.     Erectile Dysfunction Erectile dysfunction (ED) is the inability to get or keep an erection in order to have sexual intercourse. Erectile dysfunction may include:  Inability to get an erection.  Lack of enough hardness of the erection to allow penetration.  Loss of the erection before sex is finished. What are the causes? This condition may be caused by:  Certain medicines, such as: ? Pain relievers. ? Antihistamines. ? Antidepressants. ? Blood pressure medicines. ? Water pills (diuretics). ? Ulcer medicines. ? Muscle relaxants. ? Drugs.  Excessive drinking.  Psychological causes, such as: ? Anxiety. ? Depression. ? Sadness. ? Exhaustion. ? Performance fear. ? Stress.  Physical causes, such as: ? Artery problems. This may include diabetes, smoking, liver disease, or atherosclerosis. ? High blood pressure. ? Hormonal problems, such as low  testosterone. ? Obesity. ? Nerve problems. This may include back or pelvic injuries, diabetes mellitus, multiple sclerosis, or Parkinson disease. What are the signs or symptoms? Symptoms of this condition include:  Inability to get an erection.  Lack of enough hardness of the erection to allow penetration.  Loss of the erection before sex is finished.  Normal erections at some times, but with frequent unsatisfactory episodes.  Low sexual satisfaction in either partner due to erection problems.  A curved penis occurring with erection. The curve may cause pain or the penis may be too curved to allow for intercourse.  Never having nighttime erections. How is this diagnosed? This condition is often diagnosed by:  Performing a physical exam to find other diseases or specific problems with the penis.  Asking you detailed questions about the problem.  Performing blood tests to check for diabetes mellitus or to measure hormone levels.  Performing other tests to check for underlying health conditions.  Performing an ultrasound exam to check for scarring.  Performing a test to check blood flow to the penis.  Doing a sleep study at home to measure nighttime erections. How is this treated? This condition may be treated by:  Medicine taken by mouth to help you achieve an erection (oral medicine).  Hormone replacement therapy to replace low testosterone levels.  Medicine that is injected into the penis. Your health care provider may instruct you how to give yourself these injections at home.  Vacuum pump. This is a pump with a ring on it. The pump and ring are placed on the penis and used to  create pressure that helps the penis become erect.  Penile implant surgery. In this procedure, you may receive: ? An inflatable implant. This consists of cylinders, a pump, and a reservoir. The cylinders can be inflated with a fluid that helps to create an erection, and they can be deflated after  intercourse. ? A semi-rigid implant. This consists of two silicone rubber rods. The rods provide some rigidity. They are also flexible, so the penis can both curve downward in its normal position and become straight for sexual intercourse.  Blood vessel surgery, to improve blood flow to the penis. During this procedure, a blood vessel from a different part of the body is placed into the penis to allow blood to flow around (bypass) damaged or blocked blood vessels.  Lifestyle changes, such as exercising more, losing weight, and quitting smoking. Follow these instructions at home: Medicines   Take over-the-counter and prescription medicines only as told by your health care provider. Do not increase the dosage without first discussing it with your health care provider.  If you are using self-injections, perform injections as directed by your health care provider. Make sure to avoid any veins that are on the surface of the penis. After giving an injection, apply pressure to the injection site for 5 minutes. General instructions  Exercise regularly, as directed by your health care provider. Work with your health care provider to lose weight, if needed.  Do not use any products that contain nicotine or tobacco, such as cigarettes and e-cigarettes. If you need help quitting, ask your health care provider.  Before using a vacuum pump, read the instructions that come with the pump and discuss any questions with your health care provider.  Keep all follow-up visits as told by your health care provider. This is important. Contact a health care provider if:  You feel nauseous.  You vomit. Get help right away if:  You are taking oral or injectable medicines and you have an erection that lasts longer than 4 hours. If your health care provider is unavailable, go to the nearest emergency room for evaluation. An erection that lasts much longer than 4 hours can result in permanent damage to your  penis.  You have severe pain in your groin or abdomen.  You develop redness or severe swelling of your penis.  You have redness spreading up into your groin or lower abdomen.  You are unable to urinate.  You experience chest pain or a rapid heart beat (palpitations) after taking oral medicines. Summary  Erectile dysfunction (ED) is the inability to get or keep an erection during sexual intercourse. This problem can usually be treated successfully.  This condition is diagnosed based on a physical exam, your symptoms, and tests to determine the cause. Treatment varies depending on the cause, and may include medicines, hormone therapy, surgery, or vacuum pump.  You may need follow-up visits to make sure that you are using your medicines or devices correctly.  Get help right away if you are taking or injecting medicines and you have an erection that lasts longer than 4 hours. This information is not intended to replace advice given to you by your health care provider. Make sure you discuss any questions you have with your health care provider. Document Revised: 10/09/2017 Document Reviewed: 11/12/2016 Elsevier Patient Education  [place].

## 2020-01-11 NOTE — Progress Notes (Signed)
Established Patient Office Visit  Subjective:  Patient ID: Brad Simmons, male    DOB: 02-06-1961  Age: 59 y.o. MRN: [id number]  CC:  Chief Complaint  Patient presents with  . medication concerns    pt reports no side effects states it is working well would like refill today.    HPI Brad Simmons presents for   Obesity Patient reports that he is exercising He states that he is busy at work and will ride for 30 minutes He also uses a medicine ball He states that he cut back on calories from alcohol  Wt Readings from Last 3 Encounters:  01/11/20 239 lb (108.4 kg)  03/08/19 244 lb 9.6 oz (110.9 kg)  12/08/17 230 lb 3.2 oz (104.4 kg)   ED  He states that he is doing well with the viagra She reports that he does well with the viagra 100mg  and it works 100% of the time He gets 4 pills per month from the pharmacy.   He denies headaches, chest , runny nose or tinnitus when he takes the medications.  Prostate Cancer  He denies dysuria, dribbling, nocturia, urgency He has no family history of prostate cancer He denies blood in his urine or sperm No unexplained weight loss   No past medical history on file.  No past surgical history on file.  No family history on file.  Social History   Socioeconomic History  . Marital status: Single    Spouse name: Not on file  . Number of children: Not on file  . Years of education: Not on file  . Highest education level: Not on file  Occupational History  . Not on file  Tobacco Use  . Smoking status: Current Some Day Smoker    Types: Cigars  . Smokeless tobacco: Never Used  Substance and Sexual Activity  . Alcohol use: Yes    Alcohol/week: 3.0 standard drinks    Types: 3 Cans of beer per week  . Drug use: No  . Sexual activity: Yes    Birth control/protection: Condom  Other Topics Concern  . Not on file  Social History Narrative  . Not on file   Social Determinants of Health   Financial Resource Strain:   . Difficulty  of Paying Living Expenses: Not on file  Food Insecurity:   . Worried About in the Last Year: Not on file  . Ran Out of Food in the Last Year: Not on file  Transportation Needs:   . Lack of Transportation (Medical): Not on file  . Lack of Transportation (Non-Medical): Not on file  Physical Activity:   . Days of Exercise per Week: Not on file  . Minutes of Exercise per Session: Not on file  Stress:   . Feeling of Stress : Not on file  Social Connections:   . Frequency of Communication with Friends and Family: Not on file  . Frequency of Social Gatherings with Friends and Family: Not on file  . Attends Religious Services: Not on file  . Active Member of Clubs or Organizations: Not on file  . Attends Programme researcher, broadcasting/film/video Meetings: Not on file  . Marital Status: Not on file  Intimate Partner Violence:   . Fear of Current or Ex-Partner: Not on file  . Emotionally Abused: Not on file  . Physically Abused: Not on file  . Sexually Abused: Not on file    Outpatient Medications Prior to Visit  Medication Sig Dispense Refill  .  sildenafil (VIAGRA) 100 MG tablet Take 1 tablet (100 mg total) by mouth daily as needed for erectile dysfunction. 5 tablet 11   No facility-administered medications prior to visit.    No Known Allergies  ROS Review of Systems    Objective:    Physical Exam  BP (!) 142/88   Pulse 94   Temp 98.2 F (36.8 C) (Temporal)   Resp 16   Ht 5\' 11"  (1.803 m)   Wt 239 lb (108.4 kg)   SpO2 98%   BMI 33.33 kg/m  Wt Readings from Last 3 Encounters:  01/11/20 239 lb (108.4 kg)  03/08/19 244 lb 9.6 oz (110.9 kg)  12/08/17 230 lb 3.2 oz (104.4 kg)   Physical Exam  Constitutional: Oriented to person, place, and time. Appears well-developed and well-nourished.  HENT:  Head: Normocephalic and atraumatic.  Eyes: Conjunctivae and EOM are normal.  Thyroid: supple, no thyromegaly Cardiovascular: Normal rate, regular rhythm, normal heart sounds  and intact distal pulses.  No murmur heard. Pulmonary/Chest: Effort normal and breath sounds normal. No stridor. No respiratory distress. Has no wheezes.  Neurological: Is alert and oriented to person, place, and time.  Skin: Skin is warm. Capillary refill takes less than 2 seconds.  Psychiatric: Has a normal mood and affect. Behavior is normal. Judgment and thought content normal.    Health Maintenance Due  Topic Date Due  . HIV Screening  08/29/1976    There are no preventive care reminders to display for this patient.  Lab Results  Component Value Date   TSH 1.450 03/08/2019   No results found for: WBC, HGB, HCT, MCV, PLT Lab Results  Component Value Date   NA 140 03/08/2019   K 4.7 03/08/2019   CO2 24 03/08/2019   GLUCOSE 166 (H) 03/08/2019   BUN 11 03/08/2019   CREATININE 1.16 03/08/2019   BILITOT 0.7 03/08/2019   ALKPHOS 69 03/08/2019   AST 19 03/08/2019   ALT 41 03/08/2019   PROT 7.0 03/08/2019   ALBUMIN 4.5 03/08/2019   CALCIUM 9.6 03/08/2019   Lab Results  Component Value Date   CHOL 185 03/08/2019   Lab Results  Component Value Date   HDL 33 (L) 03/08/2019   Lab Results  Component Value Date   LDLCALC 122 (H) 03/08/2019   Lab Results  Component Value Date   TRIG 148 03/08/2019   Lab Results  Component Value Date   CHOLHDL 5.6 (H) 03/08/2019   Lab Results  Component Value Date   HGBA1C 5.1 05/05/2017      Assessment & Plan:   Problem List Items Addressed This Visit      Other   Erectile dysfunction  - discussed Viagra    Relevant Medications   sildenafil (VIAGRA) 100 MG tablet    Other Visit Diagnoses    Screening for malignant neoplasm of prostate    -  Primary Discussed screening for prostate disease   Relevant Orders   PSA   Class 1 obesity due to excess calories without serious comorbidity with body mass index (BMI) of 33.0 to 33.9 in adult     -   Discussed obesity and patient has improvement in his weight with activity       Meds ordered this encounter  Medications  . sildenafil (VIAGRA) 100 MG tablet    Sig: Take 1 tablet (100 mg total) by mouth daily as needed for erectile dysfunction.    Dispense:  10 tablet    Refill:  11  Follow-up: No follow-ups on file.    Brad Moron, MD

## 2020-01-12 LAB — PSA: Prostate Specific Ag, Serum: 2.8 ng/mL (ref 0.0–4.0)

## 2020-01-26 ENCOUNTER — Encounter: Payer: Self-pay | Admitting: Radiology

## 2020-09-20 ENCOUNTER — Telehealth: Payer: Self-pay | Admitting: Family Medicine

## 2020-09-20 NOTE — Telephone Encounter (Signed)
09/20/2020 - PATIENT CAME INTO THE OFFICE TO GET A REFILL ON HIS SILDENAFIL (VIAGRA) 100 MG. HE SAID THE PHARMACY TOLD HIM TO CALL HIS DOCTOR. HIS FORMER PROVIDER WAS DR. Creta Levin. I HAVE SCHEDULED A TRANSFER OF CARE WITH DR. Neva Seat ON Wednesday 12/26/2020 AT 2:00pm. PLEASE CALL PATIENT WHEN IT HAS BEEN DONE. BEST PHONE IS: [phone] (CELL)   PHARMACY CHOICE IS: [facility] ON [address] AND [address]   MBC

## 2020-09-21 NOTE — Telephone Encounter (Signed)
Called pharmacy to discuss issue with pt's Viagra medication. Pharmacy tech informed me they had an issue in their system that had rejected the prescription. The Tech informed me that he fixed the issue and that they were filling the medication now. Pt should have enough refills to get to March of 2022.

## 2020-12-26 ENCOUNTER — Encounter: Payer: BC Managed Care – PPO | Admitting: Family Medicine

## 2023-04-22 ENCOUNTER — Ambulatory Visit: Admit: 2023-04-22 | Payer: BC Managed Care – PPO | Source: Home / Self Care

## 2023-05-06 ENCOUNTER — Encounter: Payer: Self-pay | Admitting: Student

## 2023-05-06 ENCOUNTER — Ambulatory Visit: Payer: BC Managed Care – PPO | Admitting: Student

## 2023-05-06 VITALS — BP 163/97 | HR 71 | Temp 98.3°F | Ht 71.0 in | Wt 226.7 lb

## 2023-05-06 DIAGNOSIS — Z6831 Body mass index (BMI) 31.0-31.9, adult: Secondary | ICD-10-CM

## 2023-05-06 DIAGNOSIS — Z6830 Body mass index (BMI) 30.0-30.9, adult: Secondary | ICD-10-CM | POA: Insufficient documentation

## 2023-05-06 DIAGNOSIS — R739 Hyperglycemia, unspecified: Secondary | ICD-10-CM | POA: Diagnosis not present

## 2023-05-06 DIAGNOSIS — E6609 Other obesity due to excess calories: Secondary | ICD-10-CM | POA: Diagnosis not present

## 2023-05-06 DIAGNOSIS — N529 Male erectile dysfunction, unspecified: Secondary | ICD-10-CM | POA: Diagnosis not present

## 2023-05-06 DIAGNOSIS — I10 Essential (primary) hypertension: Secondary | ICD-10-CM | POA: Diagnosis not present

## 2023-05-06 DIAGNOSIS — E7800 Pure hypercholesterolemia, unspecified: Secondary | ICD-10-CM

## 2023-05-06 DIAGNOSIS — F17290 Nicotine dependence, other tobacco product, uncomplicated: Secondary | ICD-10-CM

## 2023-05-06 MED ORDER — OLMESARTAN MEDOXOMIL-HCTZ 20-12.5 MG PO TABS: 1.0000 | ORAL_TABLET | Freq: Every day | ORAL | 2 refills | Status: DC

## 2023-05-06 MED ORDER — SILDENAFIL CITRATE 100 MG PO TABS: 100.0000 mg | ORAL_TABLET | Freq: Every day | ORAL | 3 refills | Status: DC | PRN

## 2023-05-06 NOTE — Patient Instructions (Signed)
[name],  It was a pleasure seeing you in the clinic today.   I have refilled your viagra. I have prescribed a medicine to help lower your blood pressure. It is called olmesartan-hydrochlorothiazide. Please take 1 tablet every day. We are checking some labs today and I will call you to discuss these results. Please come back in 1 month for your next visit.  Please call our clinic at [phone] if you have any questions or concerns. The best time to call is Monday-Friday from 9am-4pm, but there is someone available 24/7 at the same number. If you need medication refills, please notify your pharmacy one week in advance and they will send [place] a request.   Thank you for letting us take part in your care. We look forward to seeing you next time!

## 2023-05-06 NOTE — Assessment & Plan Note (Addendum)
Patient with elevated BP in clinic today at 147/95 with repeat 163/97. He does not have a previous diagnosis of HTN but does have risk factors in obesity and intermittent cigar use. He also has a history of erectile dysfunction likely due to atherosclerotic disease from underlying HTN. He has not seen a physician in about 2 years and is here to establish care. Given BP is significantly elevated, will start on olmesartan-hydrochlorothiazide combination. Will check BMP today to ensure stable electrolytes and kidney function prior to initiation of antihypertensive therapy. Will also check lipid panel today given risk factors.  Plan: -start olmesartan-hydrochlorothiazide 20-12.5mg  daily -f/u BMP, lipid panel -f/u in 1 month for repeat BP and BMP  ADDENDUM: BMP with stable kidney function and electrolytes, appears that he may have CKD Stage II.

## 2023-05-06 NOTE — Assessment & Plan Note (Signed)
Was noted to have hyperglycemia on labs in 2020 but does not appear to have had diabetes screening. He is not on any diabetic medications and denies any history of diabetes. Will check A1c today to assess glycemic control.  Plan: -f/u A1c  ADDENDUM: A1c 5.6%, not diabetic or prediabetic.

## 2023-05-06 NOTE — Progress Notes (Signed)
CC: new patient establishing care  HPI:  Brad Simmons is a 62 y.o. male with history listed below presenting to the [facility] as a new patient here to establish care. Please see individualized problem based charting for full HPI.  Past Medical History:  Diagnosis Date   Class 1 obesity without serious comorbidity with body mass index (BMI) of 30.0 to 30.9 in adult, unspecified obesity type    Erectile dysfunction    No past surgical history.  Medications: -sildenafil 100mg  daily prn  No known allergies.  Family History  Problem Relation Age of Onset   Dementia Mother    Dementia Father    Social History   Socioeconomic History   Marital status: Single    Spouse name: Not on file   Number of children: Not on file   Years of education: Not on file   Highest education level: Not on file  Occupational History   Not on file  Tobacco Use   Smoking status: Some Days    Types: Cigars   Smokeless tobacco: Never  Vaping Use   Vaping Use: Never used  Substance and Sexual Activity   Alcohol use: Yes    Alcohol/week: 3.0 standard drinks of alcohol    Types: 3 Cans of beer per week    Comment: social alcohol use   Drug use: No   Sexual activity: Yes    Birth control/protection: Condom  Other Topics Concern   Not on file  Social History Narrative   Lives alone but has good outpatient social support with family nearby.   Social Determinants of Health   Financial Resource Strain: Not on file  Food Insecurity: Not on file  Transportation Needs: Not on file  Physical Activity: Not on file  Stress: Not on file  Social Connections: Not on file  Intimate Partner Violence: Not on file    Review of Systems:  Negative aside from that listed in individualized problem based charting.  Physical Exam:  Vitals:   05/06/23 1028 05/06/23 1109  BP: (!) 147/95 (!) 163/97  Pulse: 74 71  Temp: 98.3 F (36.8 C)   TempSrc: Oral   SpO2: 98%   Weight: 226 lb 11.2 oz (102.8 kg)    Height: 5\' 11"  (1.803 m)    Physical Exam Constitutional:      Appearance: Normal appearance. He is obese. He is not ill-appearing.  HENT:     Head: Normocephalic and atraumatic.     Mouth/Throat:     Mouth: Mucous membranes are moist.     Pharynx: Oropharynx is clear. No oropharyngeal exudate.  Eyes:     General: No scleral icterus.    Extraocular Movements: Extraocular movements intact.     Conjunctiva/sclera: Conjunctivae normal.  Cardiovascular:     Rate and Rhythm: Normal rate and regular rhythm.     Heart sounds: Normal heart sounds. No murmur heard.    No friction rub. No gallop.  Pulmonary:     Effort: Pulmonary effort is normal.     Breath sounds: Normal breath sounds. No wheezing, rhonchi or rales.  Abdominal:     General: Bowel sounds are normal. There is no distension.     Palpations: Abdomen is soft.     Tenderness: There is no abdominal tenderness.  Musculoskeletal:        General: No swelling.  Skin:    General: Skin is warm and dry.  Neurological:     General: No focal deficit present.  Mental Status: He is alert.  Psychiatric:        Mood and Affect: Mood normal.        Behavior: Behavior normal.      Assessment & Plan:   See Encounters Tab for problem based charting.  Patient discussed with Dr. Oswaldo Done

## 2023-05-06 NOTE — Assessment & Plan Note (Addendum)
Patient with long-standing erectile dysfunction, has been on sildenafil prn since 2005 per patient. Unclear as to etiology of erectile dysfunction but suspect that he has underlying atherosclerotic disease from undiagnosed HTN. He has found good benefit with sildenafil therapy. Will manage HTN and refill sildenafil 100mg  daily prn.  Plan: -manage HTN -refilled sildenafil daily prn

## 2023-05-06 NOTE — Assessment & Plan Note (Signed)
BMI 31.62. Discussed lifestyle modifications to help promote weight loss, including moderate intensity exercise for at least 2.5 hours per week. He has lost about 13 lbs over the past 3 years with some changes he has made in his diet but notes that he can further improve his diet as well.

## 2023-05-07 DIAGNOSIS — E785 Hyperlipidemia, unspecified: Secondary | ICD-10-CM | POA: Insufficient documentation

## 2023-05-07 LAB — BMP8+ANION GAP
Anion Gap: 9 mmol/L — ABNORMAL LOW (ref 10.0–18.0)
BUN/Creatinine Ratio: 10 (ref 10–24)
BUN: 11 mg/dL (ref 8–27)
CO2: 25 mmol/L (ref 20–29)
Calcium: 9.6 mg/dL (ref 8.6–10.2)
Chloride: 104 mmol/L (ref 96–106)
Creatinine, Ser: 1.11 mg/dL (ref 0.76–1.27)
Glucose: 118 mg/dL — ABNORMAL HIGH (ref 70–99)
Potassium: 4.7 mmol/L (ref 3.5–5.2)
Sodium: 138 mmol/L (ref 134–144)
eGFR: 76 mL/min/{1.73_m2} (ref >59)

## 2023-05-07 LAB — LIPID PANEL
Chol/HDL Ratio: 4.4 ratio (ref 0.0–5.0)
Cholesterol, Total: 167 mg/dL (ref 100–199)
HDL: 38 mg/dL — ABNORMAL LOW (ref >39)
LDL Chol Calc (NIH): 114 mg/dL — ABNORMAL HIGH (ref 0–99)
Triglycerides: 78 mg/dL (ref 0–149)
VLDL Cholesterol Cal: 15 mg/dL (ref 5–40)

## 2023-05-07 LAB — HEMOGLOBIN A1C
Est. average glucose Bld gHb Est-mCnc: 114 mg/dL
Hgb A1c MFr Bld: 5.6 % (ref 4.8–5.6)

## 2023-05-07 NOTE — Progress Notes (Signed)
Internal Medicine Clinic Attending  Case discussed with Dr. Jinwala  At the time of the visit.  We reviewed the resident's history and exam and pertinent patient test results.  I agree with the assessment, diagnosis, and plan of care documented in the resident's note.  

## 2023-05-07 NOTE — Assessment & Plan Note (Signed)
Lipid panel with total cholesterol 167, HDL 38, LDL 114. Using Risk Estimator, his 10-year ASCVD risk is 21.2%. We discussed cessation of intermittent cigars though he does not appear to be ready to do this just yet. Also discussed utility of statin therapy given risk. He would like to wait until next visit to further discuss this as he was just placed on antihypertensive therapy as well. We discussed importance of lipid lowering therapy but will need to re-counsel on this at follow up visit.  -encourage smoking cessation -counsel on statin therapy

## 2024-02-27 ENCOUNTER — Ambulatory Visit
Admission: EM | Admit: 2024-02-27 | Discharge: 2024-02-27 | Disposition: A | Discharge status: Home / Self Care | Attending: Family Medicine | Admitting: Family Medicine

## 2024-02-27 DIAGNOSIS — S46911A Strain of unspecified muscle, fascia and tendon at shoulder and upper arm level, right arm, initial encounter: Secondary | ICD-10-CM

## 2024-02-27 MED ORDER — KETOROLAC TROMETHAMINE 30 MG/ML IJ SOLN
15.0000 mg | Freq: Once | INTRAMUSCULAR | Status: AC
  Administered 2024-02-27: 15 mg via INTRAMUSCULAR

## 2024-02-27 MED ORDER — CYCLOBENZAPRINE HCL 5 MG PO TABS
5.0000 mg | ORAL_TABLET | Freq: Two times a day (BID) | ORAL | 0 refills | Status: DC | PRN
Start: 2024-02-27 — End: 2024-03-03

## 2024-02-27 NOTE — ED Provider Notes (Signed)
 [facility]    CSN: [id number] Arrival date & time: 02/27/24  1022      History   Chief Complaint Chief Complaint  Patient presents with   sore shoulder    HPI Brad Simmons is a 63 y.o. male presents for shoulder pain.  Patient reports he bowls frequently and about 2 weeks ago he developed some lateral right shoulder pain after doing some bowling.  States it has been intermittent.  Was improving until he went bowling couple days ago and it worsened again.  Denies any injury such as fall.  Denies swelling bruising or numbness or tingling.  No history of shoulder injuries or surgeries in the past.  He has been doing IcyHot Epsom salt packs and Tylenol with some improvement.  No other concerns at this time.  HPI  Past Medical History:  Diagnosis Date   Class 1 obesity without serious comorbidity with body mass index (BMI) of 30.0 to 30.9 in adult, unspecified obesity type    Erectile dysfunction     Patient Active Problem List   Diagnosis Date Noted   Hyperlipidemia 05/07/2023   Hypertension 05/06/2023   Hyperglycemia 05/06/2023   Class 1 obesity with body mass index (BMI) of 30.0 to 30.9 in adult 05/06/2023   Erectile dysfunction 05/05/2017    History reviewed. No pertinent surgical history.     Home Medications    Prior to Admission medications   Medication Sig Start Date End Date Taking? Authorizing Provider  cyclobenzaprine  (FLEXERIL ) 5 MG tablet Take 1 tablet (5 mg total) by mouth 2 (two) times daily as needed (shoulder pain). 02/27/24  Yes Jovon Winterhalter, Jodi R, NP  sildenafil  (VIAGRA ) 100 MG tablet Take 1 tablet (100 mg total) by mouth daily as needed for erectile dysfunction. 05/06/23  Yes Jinwala, Sagar H, MD  olmesartan -hydrochlorothiazide (BENICAR  HCT) 20-12.5 MG tablet Take 1 tablet by mouth daily. 05/06/23   Mandy Second, MD    Family History Family History  Problem Relation Age of Onset   Dementia Mother    Dementia Father     Social  History Social History   Tobacco Use   Smoking status: Some Days    Types: Cigars   Smokeless tobacco: Never  Vaping Use   Vaping status: Never Used  Substance Use Topics   Alcohol use: Yes    Alcohol/week: 3.0 standard drinks of alcohol    Types: 3 Cans of beer per week    Comment: social alcohol use   Drug use: No     Allergies   Patient has no known allergies.   Review of Systems Review of Systems  Musculoskeletal:        Right shoulder pain     Physical Exam Triage Vital Signs ED Triage Vitals  Encounter Vitals Group     BP 02/27/24 1130 (!) 163/99     Systolic BP Percentile --      Diastolic BP Percentile --      Pulse Rate 02/27/24 1130 67     Resp 02/27/24 1130 20     Temp 02/27/24 1130 98.8 F (37.1 C)     Temp Source 02/27/24 1130 Oral     SpO2 02/27/24 1130 93 %     Weight --      Height --      Head Circumference --      Peak Flow --      Pain Score 02/27/24 1131 5     Pain Loc --  Pain Education --      Exclude from Growth Chart --    No data found.  Updated Vital Signs BP (!) 163/99 (BP Location: Left Arm)   Pulse 67   Temp 98.8 F (37.1 C) (Oral)   Resp 20   SpO2 93%   Visual Acuity Right Eye Distance:   Left Eye Distance:   Bilateral Distance:    Right Eye Near:   Left Eye Near:    Bilateral Near:     Physical Exam Vitals and nursing note reviewed.  Constitutional:      General: He is not in acute distress.    Appearance: Normal appearance. He is not ill-appearing.  HENT:     Head: Normocephalic and atraumatic.  Eyes:     Pupils: Pupils are equal, round, and reactive to light.  Cardiovascular:     Rate and Rhythm: Normal rate.  Pulmonary:     Effort: Pulmonary effort is normal.  Musculoskeletal:     Right shoulder: Tenderness present. No swelling, deformity, effusion, laceration, bony tenderness or crepitus. Normal range of motion. Normal strength. Normal pulse.       Arms:     Comments: Mildly tender to  palpation over right deltoid.  Full range of motion of shoulder without restriction.  Negative Neer's and Gladys Lamp test.  Strength is 5 out of 5 bilateral upper extremities  Skin:    General: Skin is warm and dry.  Neurological:     General: No focal deficit present.     Mental Status: He is alert and oriented to person, place, and time.  Psychiatric:        Mood and Affect: Mood normal.        Behavior: Behavior normal.      UC Treatments / Results  Labs (all labs ordered are listed, but only abnormal results are displayed) Labs Reviewed - No data to display  BMP8+Anion Gap Order: [id number]  Status: Final result     Next appt: None     Dx: Primary hypertension; Hyperglycemia   Test Result Released: No (inaccessible in MyChart)   1 Result Note     View Follow-Up Encounter      Component Ref Range & Units (hover) 9 mo ago 4 yr ago 6 yr ago  Glucose 118 High  166 High  R 107 High  R  BUN 11 11 R 12 R  Creatinine, Ser 1.11 1.16 0.97  eGFR 76    BUN/Creatinine Ratio 10 9 R 12 R  Sodium 138 140 138  Potassium 4.7 4.7 4.3  Chloride 104 101 103  CO2 25 24 22  CM  Anion Gap 9.0 Low     Calcium 9.6 9.6 R 9.0 R  Resulting Agency [facility]         Narrative Performed by: Trenia Fritter Performed at:  [address] [facility] [address] [place], [place]  [id number] Lab Director: Pearlean Botts MD, Phone:  [phone]  Specimen Collected: 05/06/23 11:17 Last Resulted: 05/07/23 05:36       View All Conversations on this Encounter       EKG   Radiology No results found.  Procedures Procedures (including critical care time)  Medications Ordered in UC Medications  ketorolac  (TORADOL ) 30 MG/ML injection 15 mg (has no administration in time range)    Initial Impression / Assessment and Plan / UC Course  I have reviewed the triage vital signs and the nursing notes.  Pertinent labs & imaging results that  were available during my care of the  patient were reviewed by me and considered in my medical decision making (see chart for details).     Reviewed exam and symptoms with patient.  No red flags.  Discussed shoulder strain/muscle strain.  He was given Toradol  injection in clinic.  Monitored for 10 minutes after injection with no reaction noted and tolerated well.  He was instructed no NSAIDs for 24 hours and verbalized understanding.  Patient reports he has had muscle relaxers for similar into the past with improvement.  Will do trial of Flexeril , side effect profile reviewed.  Discussed heat rest and PCP follow-up if symptoms do not improve.  ER precautions reviewed and patient verbalized understanding. Final Clinical Impressions(s) / UC Diagnoses   Final diagnoses:  Muscle strain of right shoulder region, initial encounter     Discharge Instructions      You were given a Toradol  injection in clinic today. Do not take any over the counter NSAID's such as Advil, ibuprofen, Aleve , or naproxen  for 24 hours. You may take tylenol if needed.  May take Flexeril  twice daily as needed.  Please note this medication will make you drowsy.  Do not drink alcohol or drive while on this medication.  Use heat and rest.  Follow-up with your PCP if your symptoms do not improve.  Please go to the ER for any worsening symptoms.  Hope you feel better soon!      ED Prescriptions     Medication Sig Dispense Auth. Provider   cyclobenzaprine  (FLEXERIL ) 5 MG tablet Take 1 tablet (5 mg total) by mouth 2 (two) times daily as needed (shoulder pain). 10 tablet Nataline Basara, Jodi R, NP      PDMP not reviewed this encounter.   Alleen Arbour, NP 02/27/24 1218

## 2024-02-27 NOTE — Discharge Instructions (Addendum)
 You were given a Toradol  injection in clinic today. Do not take any over the counter NSAID's such as Advil, ibuprofen, Aleve , or naproxen  for 24 hours. You may take tylenol if needed.  May take Flexeril  twice daily as needed.  Please note this medication will make you drowsy.  Do not drink alcohol or drive while on this medication.  Use heat and rest.  Follow-up with your PCP if your symptoms do not improve.  Please go to the ER for any worsening symptoms.  Hope you feel better soon!

## 2024-02-27 NOTE — ED Triage Notes (Signed)
 Pt c/o sore shoulder x 2weeks. Started afer bowling.Used icy hot, epsom salt, ice packs and tylenol.

## 2024-03-03 ENCOUNTER — Ambulatory Visit (INDEPENDENT_AMBULATORY_CARE_PROVIDER_SITE_OTHER)

## 2024-03-03 ENCOUNTER — Ambulatory Visit
Admission: EM | Admit: 2024-03-03 | Discharge: 2024-03-03 | Disposition: A | Discharge status: Home / Self Care | Attending: Family Medicine | Admitting: Family Medicine

## 2024-03-03 DIAGNOSIS — M25511 Pain in right shoulder: Secondary | ICD-10-CM

## 2024-03-03 MED ORDER — NAPROXEN 500 MG PO TABS: 500.0000 mg | ORAL_TABLET | Freq: Two times a day (BID) | ORAL | 0 refills | Status: DC | PRN

## 2024-03-03 NOTE — Discharge Instructions (Addendum)
 By my review, there were no broken bones or worrisome changes on your x-rays.  The radiologist will also read your x-ray, and if their interpretation differs significantly from mine, and the management of your condition would change, we will call you.  Take naproxen  500 mg--1 tablet every 12 hours as needed for pain  Please follow-up with your primary care and consider calling orthopedics for evaluation

## 2024-03-03 NOTE — ED Triage Notes (Signed)
 Pt present with c/o rt shoulder pain and soreness. Pt states he was given a muscle relaxer that has not helped.

## 2024-03-03 NOTE — ED Provider Notes (Signed)
 [facility]    CSN: [id number] Arrival date & time: 03/03/24  1007      History   Chief Complaint Chief Complaint  Patient presents with   Shoulder Pain    HPI Brad Simmons is a 64 y.o. male.    Shoulder Pain Here for right shoulder pain it has been bothering him for about 2 weeks.  He was seen here on April 19 and provided Toradol  injection and Flexeril  prescription.  He did not find either medication to be helpful.  There is no trauma or fall.  He wonders if bowling a lot has caused this pain.  He does not have any neck pain or posterior shoulder pain  NKDA  Last EGFR was 78; he does not take blood thinners.  Past Medical History:  Diagnosis Date   Class 1 obesity without serious comorbidity with body mass index (BMI) of 30.0 to 30.9 in adult, unspecified obesity type    Erectile dysfunction     Patient Active Problem List   Diagnosis Date Noted   Hyperlipidemia 05/07/2023   Hypertension 05/06/2023   Hyperglycemia 05/06/2023   Class 1 obesity with body mass index (BMI) of 30.0 to 30.9 in adult 05/06/2023   Erectile dysfunction 05/05/2017    History reviewed. No pertinent surgical history.     Home Medications    Prior to Admission medications   Medication Sig Start Date End Date Taking? Authorizing Provider  naproxen  (NAPROSYN ) 500 MG tablet Take 1 tablet (500 mg total) by mouth 2 (two) times daily as needed (pain). 03/03/24  Yes Lekita Kerekes K, MD  olmesartan -hydrochlorothiazide (BENICAR  HCT) 20-12.5 MG tablet Take 1 tablet by mouth daily. 05/06/23   Jinwala, Sagar H, MD  sildenafil  (VIAGRA ) 100 MG tablet Take 1 tablet (100 mg total) by mouth daily as needed for erectile dysfunction. 05/06/23   Mandy Second, MD    Family History Family History  Problem Relation Age of Onset   Dementia Mother    Dementia Father     Social History Social History   Tobacco Use   Smoking status: Some Days    Types: Cigars   Smokeless tobacco: Never   Vaping Use   Vaping status: Never Used  Substance Use Topics   Alcohol use: Yes    Alcohol/week: 3.0 standard drinks of alcohol    Types: 3 Cans of beer per week    Comment: social alcohol use   Drug use: No     Allergies   Patient has no known allergies.   Review of Systems Review of Systems   Physical Exam Triage Vital Signs ED Triage Vitals [03/03/24 1024]  Encounter Vitals Group     BP (!) 160/101     Systolic BP Percentile      Diastolic BP Percentile      Pulse Rate 94     Resp 17     Temp 98.8 F (37.1 C)     Temp Source Oral     SpO2 95 %     Weight      Height      Head Circumference      Peak Flow      Pain Score 6     Pain Loc      Pain Education      Exclude from Growth Chart    No data found.  Updated Vital Signs BP (!) 160/101 (BP Location: Left Arm)   Pulse 94   Temp 98.8 F (37.1 C) (  Oral)   Resp 17   SpO2 95%   Visual Acuity Right Eye Distance:   Left Eye Distance:   Bilateral Distance:    Right Eye Near:   Left Eye Near:    Bilateral Near:     Physical Exam Vitals reviewed.  Constitutional:      General: He is not in acute distress.    Appearance: He is not ill-appearing, toxic-appearing or diaphoretic.  Cardiovascular:     Rate and Rhythm: Normal rate and regular rhythm.  Pulmonary:     Effort: Pulmonary effort is normal.     Breath sounds: Normal breath sounds.  Musculoskeletal:     Comments: There is some mild tenderness over the upper deltoid region, at the right shoulder joint.  Skin:    Coloration: Skin is not pale.  Neurological:     Mental Status: He is alert.      UC Treatments / Results  Labs (all labs ordered are listed, but only abnormal results are displayed) Labs Reviewed - No data to display  EKG   Radiology No results found.  Procedures Procedures (including critical care time)  Medications Ordered in UC Medications - No data to display  Initial Impression / Assessment and Plan / UC  Course  I have reviewed the triage vital signs and the nursing notes.  Pertinent labs & imaging results that were available during my care of the patient were reviewed by me and considered in my medical decision making (see chart for details).     By my review, there are no acute changes on his x-rays.  He is advised of radiology overread.  Naproxen  is sent in to treat the pain and inflammation.  He is given contact information for orthopedics and have asked him to also follow-up with his primary care. Final Clinical Impressions(s) / UC Diagnoses   Final diagnoses:  Acute pain of right shoulder     Discharge Instructions      By my review, there were no broken bones or worrisome changes on your x-rays.  The radiologist will also read your x-ray, and if their interpretation differs significantly from mine, and the management of your condition would change, we will call you.  Take naproxen  500 mg--1 tablet every 12 hours as needed for pain  Please follow-up with your primary care and consider calling orthopedics for evaluation     ED Prescriptions     Medication Sig Dispense Auth. Provider   naproxen  (NAPROSYN ) 500 MG tablet Take 1 tablet (500 mg total) by mouth 2 (two) times daily as needed (pain). 20 tablet Orie Baxendale K, MD      PDMP not reviewed this encounter.   Ann Keto, MD 03/03/24 1059

## 2024-03-21 ENCOUNTER — Ambulatory Visit
Admission: EM | Admit: 2024-03-21 | Discharge: 2024-03-21 | Disposition: A | Discharge status: Home / Self Care | Attending: Family Medicine | Admitting: Family Medicine

## 2024-03-21 DIAGNOSIS — M25511 Pain in right shoulder: Secondary | ICD-10-CM | POA: Diagnosis not present

## 2024-03-21 MED ORDER — METHYLPREDNISOLONE 4 MG PO TBPK
ORAL_TABLET | ORAL | 0 refills | Status: DC
Start: 2024-03-21 — End: 2024-09-30

## 2024-03-21 NOTE — ED Triage Notes (Signed)
 Pt present with c/o rt shoulder pain since last month. Pt states this is his third visit and nothing has been helping, c/o the muscle relaxer's prescribed not giving any relief. Has tried at home exercises.

## 2024-03-21 NOTE — ED Provider Notes (Signed)
 [facility]    CSN: [id number] Arrival date & time: 03/21/24  2130      History   Chief Complaint Chief Complaint  Patient presents with   Shoulder Pain    HPI Brad Simmons is a 63 y.o. male presents for shoulder pain.  Patient has seen twice in urgent care for right shoulder pain.  First visit was on April 19 where he was given a muscle relaxer and treated for muscle strain.  He had no improvement with this so he returned on April 24 shoulder x-ray that was negative and was given naproxen  to take twice daily.  He states he is had no improvement with that.  He does continue to bowl but he states it feels better when he bowls.  Pain is primarily laterally and with movement only.  No swelling, numbness/tingling/weakness.  No injury.  No other concerns at this time.   Shoulder Pain   Past Medical History:  Diagnosis Date   Class 1 obesity without serious comorbidity with body mass index (BMI) of 30.0 to 30.9 in adult, unspecified obesity type    Erectile dysfunction     Patient Active Problem List   Diagnosis Date Noted   Hyperlipidemia 05/07/2023   Hypertension 05/06/2023   Hyperglycemia 05/06/2023   Class 1 obesity with body mass index (BMI) of 30.0 to 30.9 in adult 05/06/2023   Erectile dysfunction 05/05/2017    History reviewed. No pertinent surgical history.     Home Medications    Prior to Admission medications   Medication Sig Start Date End Date Taking? Authorizing Provider  methylPREDNISolone (MEDROL DOSEPAK) 4 MG TBPK tablet Take as prescribed on package 03/21/24  Yes Shalisha Clausing, Jodi R, NP  naproxen  (NAPROSYN ) 500 MG tablet Take 1 tablet (500 mg total) by mouth 2 (two) times daily as needed (pain). 03/03/24   Banister, Pamela K, MD  olmesartan -hydrochlorothiazide (BENICAR  HCT) 20-12.5 MG tablet Take 1 tablet by mouth daily. 05/06/23   Jinwala, Sagar H, MD  sildenafil  (VIAGRA ) 100 MG tablet Take 1 tablet (100 mg total) by mouth daily as needed for erectile  dysfunction. 05/06/23   Mandy Second, MD    Family History Family History  Problem Relation Age of Onset   Dementia Mother    Dementia Father     Social History Social History   Tobacco Use   Smoking status: Some Days    Types: Cigars   Smokeless tobacco: Never  Vaping Use   Vaping status: Never Used  Substance Use Topics   Alcohol use: Yes    Alcohol/week: 3.0 standard drinks of alcohol    Types: 3 Cans of beer per week    Comment: social alcohol use   Drug use: No     Allergies   Patient has no known allergies.   Review of Systems Review of Systems  Musculoskeletal:        Right shoulder pain     Physical Exam Triage Vital Signs ED Triage Vitals  Encounter Vitals Group     BP 03/21/24 1044 (!) 156/86     Systolic BP Percentile --      Diastolic BP Percentile --      Pulse Rate 03/21/24 1044 94     Resp 03/21/24 1044 18     Temp 03/21/24 1044 98.5 F (36.9 C)     Temp Source 03/21/24 1044 Oral     SpO2 03/21/24 1044 93 %     Weight --  Height --      Head Circumference --      Peak Flow --      Pain Score 03/21/24 1043 5     Pain Loc --      Pain Education --      Exclude from Growth Chart --    No data found.  Updated Vital Signs BP (!) 156/86 (BP Location: Left Arm)   Pulse 94   Temp 98.5 F (36.9 C) (Oral)   Resp 18   SpO2 93%   Visual Acuity Right Eye Distance:   Left Eye Distance:   Bilateral Distance:    Right Eye Near:   Left Eye Near:    Bilateral Near:     Physical Exam Vitals and nursing note reviewed.  Constitutional:      General: He is not in acute distress.    Appearance: Normal appearance. He is not ill-appearing.  HENT:     Head: Normocephalic and atraumatic.  Eyes:     Pupils: Pupils are equal, round, and reactive to light.  Cardiovascular:     Rate and Rhythm: Normal rate.  Pulmonary:     Effort: Pulmonary effort is normal.  Musculoskeletal:     Right shoulder: Tenderness present. No swelling,  deformity, effusion, laceration, bony tenderness or crepitus. Normal range of motion. Normal strength. Normal pulse.       Arms:     Comments: Mildly tender to right lateral shoulder without swelling or ecchymosis.  Full range of motion of shoulder without restriction and slight discomfort with lateral raise.  Positive Gladys Lamp test.  Strength 5 out of 5 bilateral upper extremities.  Skin:    General: Skin is warm and dry.  Neurological:     General: No focal deficit present.     Mental Status: He is alert and oriented to person, place, and time.  Psychiatric:        Mood and Affect: Mood normal.        Behavior: Behavior normal.      UC Treatments / Results  Labs (all labs ordered are listed, but only abnormal results are displayed) Labs Reviewed - No data to display  EKG   Radiology No results found.  Procedures Procedures (including critical care time)  Medications Ordered in UC Medications - No data to display  Initial Impression / Assessment and Plan / UC Course  I have reviewed the triage vital signs and the nursing notes.  Pertinent labs & imaging results that were available during my care of the patient were reviewed by me and considered in my medical decision making (see chart for details).     Reviewed exam and symptoms with patient.  No red flags.  Patient has had unsuccessful treatment with muscle relaxers and naproxen .  X-rays have been normal.  Will do trial of Medrol Dosepak and advised that he follow-up with orthopedics if his symptoms do not improve as he may need additional imaging/workup that were unable to do in this setting.  Also encouraged PCP follow-up 1 week for recheck.  ER precautions reviewed and patient verbalized understanding. Final Clinical Impressions(s) / UC Diagnoses   Final diagnoses:  Acute pain of right shoulder     Discharge Instructions      Start Medrol Dosepak as prescribed.  Please contact orthopedics at the  information given above to make a follow-up appointment if your symptoms persist.  Follow-up with your PCP 1 week for recheck.  Please go to the ER for  any worsening symptoms.  Hope you feel better soon!  ED Prescriptions     Medication Sig Dispense Auth. Provider   methylPREDNISolone (MEDROL DOSEPAK) 4 MG TBPK tablet Take as prescribed on package 21 tablet Jahnya Trindade, Jodi R, NP      PDMP not reviewed this encounter.   Alleen Arbour, NP 03/21/24 1154

## 2024-03-21 NOTE — Discharge Instructions (Addendum)
 Start Medrol Dosepak as prescribed.  Please contact orthopedics at the information given above to make a follow-up appointment if your symptoms persist.  Follow-up with your PCP 1 week for recheck.  Please go to the ER for any worsening symptoms.  Hope you feel better soon!

## 2024-03-29 ENCOUNTER — Other Ambulatory Visit: Payer: Self-pay

## 2024-03-29 DIAGNOSIS — N529 Male erectile dysfunction, unspecified: Secondary | ICD-10-CM

## 2024-03-29 NOTE — Telephone Encounter (Signed)
 Attempted to call the patient x2, unable to reach the patient.

## 2024-03-29 NOTE — Telephone Encounter (Signed)
 Patient last seen 05/06/23, I called the patient to schedule a follow up appointment. Unable to reach the patient,vm is not set up.

## 2024-09-30 ENCOUNTER — Ambulatory Visit: Admitting: Student

## 2024-09-30 VITALS — BP 139/91 | HR 72 | Temp 98.0°F | Wt 222.6 lb

## 2024-09-30 DIAGNOSIS — N529 Male erectile dysfunction, unspecified: Secondary | ICD-10-CM

## 2024-09-30 DIAGNOSIS — E785 Hyperlipidemia, unspecified: Secondary | ICD-10-CM | POA: Diagnosis not present

## 2024-09-30 DIAGNOSIS — Z0000 Encounter for general adult medical examination without abnormal findings: Secondary | ICD-10-CM

## 2024-09-30 DIAGNOSIS — I10 Essential (primary) hypertension: Secondary | ICD-10-CM | POA: Diagnosis not present

## 2024-09-30 DIAGNOSIS — F17290 Nicotine dependence, other tobacco product, uncomplicated: Secondary | ICD-10-CM

## 2024-09-30 DIAGNOSIS — Z79899 Other long term (current) drug therapy: Secondary | ICD-10-CM

## 2024-09-30 MED ORDER — AMLODIPINE BESYLATE 5 MG PO TABS: 5.0000 mg | ORAL_TABLET | Freq: Every day | ORAL | 0 refills | Status: DC

## 2024-09-30 NOTE — Progress Notes (Unsigned)
 CC: Follow-up  HPI:  Mr.Brad Simmons is a 63 y.o. male living with a history stated below and presents today for follow-up. Please see problem based assessment and plan for additional details.  Past Medical History:  Diagnosis Date   Class 1 obesity without serious comorbidity with body mass index (BMI) of 30.0 to 30.9 in adult, unspecified obesity type    Erectile dysfunction     Current Outpatient Medications on File Prior to Visit  Medication Sig Dispense Refill   olmesartan -hydrochlorothiazide (BENICAR  HCT) 20-12.5 MG tablet Take 1 tablet by mouth daily. 30 tablet 2   sildenafil  (VIAGRA ) 100 MG tablet Take 1 tablet (100 mg total) by mouth daily as needed for erectile dysfunction. 10 tablet 3   No current facility-administered medications on file prior to visit.    Family History  Problem Relation Age of Onset   Dementia Mother    Dementia Father     Social History   Socioeconomic History   Marital status: Single    Spouse name: Not on file   Number of children: Not on file   Years of education: Not on file   Highest education level: Not on file  Occupational History   Not on file  Tobacco Use   Smoking status: Some Days    Types: Cigars   Smokeless tobacco: Never  Vaping Use   Vaping status: Never Used  Substance and Sexual Activity   Alcohol use: Yes    Alcohol/week: 3.0 standard drinks of alcohol    Types: 3 Cans of beer per week    Comment: social alcohol use   Drug use: No   Sexual activity: Yes    Birth control/protection: Condom  Other Topics Concern   Not on file  Social History Narrative   Lives alone but has good outpatient social support with family nearby.   Social Drivers of Corporate Investment Banker Strain: Not on file  Food Insecurity: Not on file  Transportation Needs: Not on file  Physical Activity: Not on file  Stress: Not on file  Social Connections: Not on file  Intimate Partner Violence: Not on file    Review of  Systems: ROS negative except for what is noted on the assessment and plan.  Vitals:   09/30/24 0935 09/30/24 0947  BP: (!) 166/89 (!) 139/91  Pulse: 81 72  Temp: 98 F (36.7 C)   TempSrc: Oral   SpO2: 96%   Weight: 222 lb 9.6 oz (101 kg)     Physical Exam: Constitutional: well-appearing, sitting in chair, in no acute distress Cardiovascular: regular rate and rhythm, no m/r/g Pulmonary/Chest: normal work of breathing on room air, lungs clear to auscultation bilaterally Psych: normal mood and behavior  Assessment & Plan:     Patient discussed with Dr. Karna  Hypertension BP is 166/89 and 139/91.  Does not check BP at home.  Originally prescribed Benicar  but has been off of this for a while.  Denies headaches, visual changes, chest pain.  Will start amlodipine  5 mg daily for now and he will follow-up with me on 12/5 with BP log.  Will also check renin: Aldosterone ratio to rule out secondary causes of hypertension.  Erectile dysfunction Has been prescribed Viagra  for this in the past.  Does endorse nocturnal erections.  Given that he is hypertensive today, I would like to get a better idea of his blood pressure before refilling this.  He was last seen in office 04/2023.  Assuming BP is  well-controlled and he is without cardiac symptoms, will plan to refill this at follow-up.  Hyperlipidemia Check fasting lipid panel.  LDL was 114 last year.  Will also check CMP for potential statin need.   Norman Lobstein, D.O. [facility], PGY-2 Phone: [phone] Date 10/03/2024 Time 11:38 AM

## 2024-10-03 NOTE — Assessment & Plan Note (Addendum)
 Check fasting lipid panel.  LDL was 114 last year.  Will also check CMP for potential statin need.

## 2024-10-03 NOTE — Assessment & Plan Note (Signed)
 Has been prescribed Viagra  for this in the past.  Does endorse nocturnal erections.  Given that he is hypertensive today, I would like to get a better idea of his blood pressure before refilling this.  He was last seen in office 04/2023.  Assuming BP is well-controlled and he is without cardiac symptoms, will plan to refill this at follow-up.

## 2024-10-03 NOTE — Assessment & Plan Note (Addendum)
 BP is 166/89 and 139/91.  Does not check BP at home.  Originally prescribed Benicar  but has been off of this for a while.  Denies headaches, visual changes, chest pain.  Will start amlodipine  5 mg daily for now and he will follow-up with me on 12/5 with BP log.  Will also check renin: Aldosterone ratio to rule out secondary causes of hypertension.

## 2024-10-03 NOTE — Progress Notes (Signed)
 Internal Medicine Clinic Attending  Case discussed with the resident at the time of the visit.  We reviewed the resident's history and exam and pertinent patient test results.  I agree with the assessment, diagnosis, and plan of care documented in the resident's note.

## 2024-10-05 ENCOUNTER — Other Ambulatory Visit: Payer: Self-pay | Admitting: Student

## 2024-10-05 ENCOUNTER — Ambulatory Visit: Payer: Self-pay | Admitting: Student

## 2024-10-06 LAB — ALDOSTERONE + RENIN ACTIVITY W/ RATIO
Aldos/Renin Ratio: 5.1 (ref 0.0–30.0)
Aldosterone: 3.6 ng/dL (ref 0.0–30.0)
Renin Activity, Plasma: 0.7 ng/mL/h (ref 0.167–5.380)

## 2024-10-06 LAB — COMPREHENSIVE METABOLIC PANEL WITH GFR
ALT: 25 IU/L (ref 0–44)
AST: 19 IU/L (ref 0–40)
Albumin: 4.5 g/dL (ref 3.9–4.9)
Alkaline Phosphatase: 77 IU/L (ref 47–123)
BUN/Creatinine Ratio: 10 (ref 10–24)
BUN: 11 mg/dL (ref 8–27)
Bilirubin Total: 1 mg/dL (ref 0.0–1.2)
CO2: 25 mmol/L (ref 20–29)
Calcium: 9.6 mg/dL (ref 8.6–10.2)
Chloride: 101 mmol/L (ref 96–106)
Creatinine, Ser: 1.06 mg/dL (ref 0.76–1.27)
Globulin, Total: 2.5 g/dL (ref 1.5–4.5)
Glucose: 106 mg/dL — ABNORMAL HIGH (ref 70–99)
Potassium: 4.7 mmol/L (ref 3.5–5.2)
Sodium: 139 mmol/L (ref 134–144)
Total Protein: 7 g/dL (ref 6.0–8.5)
eGFR: 79 mL/min/1.73 (ref >59)

## 2024-10-06 LAB — LIPID PANEL
Chol/HDL Ratio: 5.2 ratio — ABNORMAL HIGH (ref 0.0–5.0)
Cholesterol, Total: 171 mg/dL (ref 100–199)
HDL: 33 mg/dL — ABNORMAL LOW (ref >39)
LDL Chol Calc (NIH): 119 mg/dL — ABNORMAL HIGH (ref 0–99)
Triglycerides: 100 mg/dL (ref 0–149)
VLDL Cholesterol Cal: 19 mg/dL (ref 5–40)

## 2024-10-06 LAB — HIV ANTIBODY (ROUTINE TESTING W REFLEX): HIV Screen 4th Generation wRfx: NONREACTIVE

## 2024-10-14 ENCOUNTER — Ambulatory Visit: Admitting: Student

## 2024-10-14 VITALS — BP 149/100 | HR 78 | Temp 98.4°F | Ht 71.0 in | Wt 220.8 lb

## 2024-10-14 DIAGNOSIS — I10 Essential (primary) hypertension: Secondary | ICD-10-CM | POA: Diagnosis not present

## 2024-10-14 DIAGNOSIS — E785 Hyperlipidemia, unspecified: Secondary | ICD-10-CM | POA: Diagnosis not present

## 2024-10-14 DIAGNOSIS — N529 Male erectile dysfunction, unspecified: Secondary | ICD-10-CM

## 2024-10-14 MED ORDER — ROSUVASTATIN CALCIUM 5 MG PO TABS: 5.0000 mg | ORAL_TABLET | Freq: Every day | ORAL | 3 refills | Status: AC

## 2024-10-14 MED ORDER — SILDENAFIL CITRATE 100 MG PO TABS: 100.0000 mg | ORAL_TABLET | Freq: Every day | ORAL | 3 refills | Status: AC | PRN

## 2024-10-14 MED ORDER — AMLODIPINE BESYLATE 5 MG PO TABS: 5.0000 mg | ORAL_TABLET | Freq: Every day | ORAL | 3 refills | Status: AC

## 2024-10-14 NOTE — Progress Notes (Unsigned)
   CC: Follow-up  HPI:  Mr.Brad Simmons is a 63 y.o. male living with a history stated below and presents today for follow-up. Please see problem based assessment and plan for additional details.  Past Medical History:  Diagnosis Date   Class 1 obesity without serious comorbidity with body mass index (BMI) of 30.0 to 30.9 in adult, unspecified obesity type    Erectile dysfunction     No current outpatient medications on file prior to visit.   No current facility-administered medications on file prior to visit.    Family History  Problem Relation Age of Onset   Dementia Mother    Dementia Father     Social History   Socioeconomic History   Marital status: Single    Spouse name: Not on file   Number of children: Not on file   Years of education: Not on file   Highest education level: Not on file  Occupational History   Not on file  Tobacco Use   Smoking status: Some Days    Types: Cigars   Smokeless tobacco: Never  Vaping Use   Vaping status: Never Used  Substance and Sexual Activity   Alcohol use: Yes    Alcohol/week: 3.0 standard drinks of alcohol    Types: 3 Cans of beer per week    Comment: social alcohol use   Drug use: No   Sexual activity: Yes    Birth control/protection: Condom  Other Topics Concern   Not on file  Social History Narrative   Lives alone but has good outpatient social support with family nearby.   Social Drivers of Corporate Investment Banker Strain: Not on file  Food Insecurity: Not on file  Transportation Needs: Not on file  Physical Activity: Not on file  Stress: Not on file  Social Connections: Not on file  Intimate Partner Violence: Not on file    Review of Systems: ROS negative except for what is noted on the assessment and plan.  Vitals:   10/14/24 1004 10/14/24 1026  BP: (!) 151/90 (!) 149/100  Pulse: 81 78  Temp: 98.4 F (36.9 C)   TempSrc: Oral   SpO2: 99%   Weight: 220 lb 12.8 oz (100.2 kg)   Height: 5' 11  (1.803 m)     Physical Exam: Constitutional: well-appearing, sitting in chair, in no acute distress Cardiovascular: regular rate and rhythm, no m/r/g Pulmonary/Chest: normal work of breathing on room air, lungs clear to auscultation bilaterally Abdominal: soft, non-tender, non-distended MSK: normal bulk and tone Skin: warm and dry Psych: normal mood and behavior  Assessment & Plan:   Patient discussed with Dr. Karna  Hypertension BP is 151/90 and 149/100. Started Amlodipine  5 mg last week and ambulatory readings look great. He will come in for CMP (see below) in 1 month and will bring in his BP cuff to ensure accuracy. Denies HA, chest pain, visual changes.   Hyperlipidemia LDL is 119 with goal < 100. Start Crestor  5 mg daily and check CMP in one month.  Erectile dysfunction Refilled Viagra .   Norman Lobstein, D.O. [facility] Internal Medicine, PGY-2 Date 10/16/2024 Time 6:09 PM

## 2024-10-16 NOTE — Assessment & Plan Note (Signed)
 BP is 151/90 and 149/100. Started Amlodipine  5 mg last week and ambulatory readings look great. He will come in for CMP (see below) in 1 month and will bring in his BP cuff to ensure accuracy. Denies HA, chest pain, visual changes.

## 2024-10-16 NOTE — Assessment & Plan Note (Signed)
 Refilled Viagra

## 2024-10-16 NOTE — Assessment & Plan Note (Signed)
 LDL is 119 with goal < 100. Start Crestor  5 mg daily and check CMP in one month.

## 2024-10-17 NOTE — Progress Notes (Signed)
 Internal Medicine Clinic Attending  Case discussed with the resident at the time of the visit.  We reviewed the resident's history and exam and pertinent patient test results.  I agree with the assessment, diagnosis, and plan of care documented in the resident's note.

## 2024-11-18 ENCOUNTER — Ambulatory Visit: Admitting: *Deleted

## 2024-11-18 DIAGNOSIS — E785 Hyperlipidemia, unspecified: Secondary | ICD-10-CM | POA: Diagnosis not present

## 2024-11-18 NOTE — Progress Notes (Signed)
" ° ° °  Brad Simmons presented today for blood pressure check. Patient is prescribed blood pressure medications and I confirmed that patient did not take their blood pressure medication prior to todays appointment. Pt stated he ran out of his BP medication and he has been drinking vinegar and water. I Informed pt all his medications were refilled on 10/14/24 with refills - he stated the pharmacy did not call him but he will go to the pharmacy when leaves here. Blood pressure was taken in the usual and appropriate manner using an automated BP cuff.     Vitals:   11/18/24 1001 11/18/24 1008  BP: (!) 136/92 122/78   Pt did not bring his BP monitor but he did bring in his BP log.   Results of todays visit will be routed to Dr. Marylu for review and further management.     "

## 2024-11-19 LAB — COMPREHENSIVE METABOLIC PANEL WITH GFR
ALT: 26 IU/L (ref 0–44)
AST: 17 IU/L (ref 0–40)
Albumin: 4.4 g/dL (ref 3.9–4.9)
Alkaline Phosphatase: 75 IU/L (ref 47–123)
BUN/Creatinine Ratio: 11 (ref 10–24)
BUN: 11 mg/dL (ref 8–27)
Bilirubin Total: 0.9 mg/dL (ref 0.0–1.2)
CO2: 25 mmol/L (ref 20–29)
Calcium: 9 mg/dL (ref 8.6–10.2)
Chloride: 103 mmol/L (ref 96–106)
Creatinine, Ser: 0.98 mg/dL (ref 0.76–1.27)
Globulin, Total: 2.4 g/dL (ref 1.5–4.5)
Glucose: 106 mg/dL — ABNORMAL HIGH (ref 70–99)
Potassium: 4.5 mmol/L (ref 3.5–5.2)
Sodium: 139 mmol/L (ref 134–144)
Total Protein: 6.8 g/dL (ref 6.0–8.5)
eGFR: 87 mL/min/1.73
